# Patient Record
Sex: Male | Born: 1990 | Race: Black or African American | Hispanic: No | Marital: Single | Smoking: Never smoker
Health system: Southern US, Community
[De-identification: ages and names within clinical notes are randomized; demographics above are authoritative.]

## PROBLEM LIST (undated history)

## (undated) ENCOUNTER — Ambulatory Visit: Admission: EM | Payer: 59

## (undated) DIAGNOSIS — J45909 Unspecified asthma, uncomplicated: Secondary | ICD-10-CM

## (undated) DIAGNOSIS — L309 Dermatitis, unspecified: Secondary | ICD-10-CM

## (undated) HISTORY — PX: LEG SURGERY: SHX1003

## (undated) HISTORY — DX: Dermatitis, unspecified: L30.9

---
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
## 2015-07-26 ENCOUNTER — Other Ambulatory Visit: Payer: Self-pay

## 2015-07-29 ENCOUNTER — Ambulatory Visit: Payer: Self-pay | Admitting: Allergy and Immunology

## 2015-08-05 ENCOUNTER — Encounter: Payer: Self-pay | Admitting: Allergy and Immunology

## 2015-08-05 ENCOUNTER — Ambulatory Visit (INDEPENDENT_AMBULATORY_CARE_PROVIDER_SITE_OTHER): Payer: BLUE CROSS/BLUE SHIELD | Admitting: Allergy and Immunology

## 2015-08-05 VITALS — BP 116/68 | HR 70 | Temp 98.0°F | Resp 18

## 2015-08-05 DIAGNOSIS — J309 Allergic rhinitis, unspecified: Secondary | ICD-10-CM

## 2015-08-05 DIAGNOSIS — H1010 Acute atopic conjunctivitis, unspecified eye: Secondary | ICD-10-CM | POA: Diagnosis not present

## 2015-08-05 DIAGNOSIS — J4530 Mild persistent asthma, uncomplicated: Secondary | ICD-10-CM

## 2015-08-05 MED ORDER — MOMETASONE FUROATE 50 MCG/ACT NA SUSP: 2.0000 | Freq: Every day | NASAL | Status: DC

## 2015-08-05 MED ORDER — EPINEPHRINE 0.3 MG/0.3ML IJ SOAJ: 0.3000 mg | Freq: Once | INTRAMUSCULAR | Status: DC

## 2015-08-05 MED ORDER — BECLOMETHASONE DIPROPIONATE 40 MCG/ACT IN AERS: 2.0000 | INHALATION_SPRAY | Freq: Every day | RESPIRATORY_TRACT | Status: DC

## 2015-08-05 MED ORDER — ALBUTEROL SULFATE 108 (90 BASE) MCG/ACT IN AEPB: 2.0000 | INHALATION_SPRAY | RESPIRATORY_TRACT | Status: DC | PRN

## 2015-08-05 MED ORDER — ALBUTEROL SULFATE HFA 108 (90 BASE) MCG/ACT IN AERS: 2.0000 | INHALATION_SPRAY | Freq: Four times a day (QID) | RESPIRATORY_TRACT | Status: DC | PRN

## 2015-08-05 NOTE — Patient Instructions (Addendum)
Continue Allegra.  Change Pulmicort to QVAR 1 to 2 puffs daily.  Continue food avoidance pattern.  Schedule egg in office challenge.  As needed Nasonex, ProAir respiclick, Epi-pen, Benadryl.   Saline nasal wash once daily and as needed.   Call with additional questions or concerns.   Follow-up in 6 months or sooner if needed.

## 2015-08-05 NOTE — Progress Notes (Signed)
FOLLOW UP NOTE  RE: Leonard Fuller MRN: [id number] DOB: 04-Aug-1990 [facility] [place] [address] [place] [address] Date of Office Visit: 08/05/2015  Subjective:  Leonard Fuller is a 25 y.o. male who presents today for Medication Management  Assessment:   1. Mild persistent asthma, well controlled.    2. Allergic rhinoconjunctivitis.    3.      Food allergy--avoidance and emergency action plan in place.  (Peanut, tree nut, fish, egg, dairy, shellfish) 4.      Negative egg skin prick testing with minimally reactive class I specific IgE egg white, both in 2013. Plan:   Meds ordered this encounter  Medications  . albuterol (PROAIR HFA) 108 (90 Base) MCG/ACT inhaler    Sig: Inhale 2 puffs into the lungs every 6 (six) hours as needed for wheezing or shortness of breath.    Dispense:  1 Inhaler    Refill:  1  . mometasone (NASONEX) 50 MCG/ACT nasal spray    Sig: Place 2 sprays into the nose daily.    Dispense:  17 g    Refill:  5  . EPINEPHrine (EPIPEN 2-PAK) 0.3 mg/0.3 mL IJ SOAJ injection    Sig: Inject 0.3 mLs (0.3 mg total) into the muscle once.    Dispense:  2 Device    Refill:  1  . beclomethasone (QVAR) 40 MCG/ACT inhaler    Sig: Inhale 2 puffs into the lungs daily.    Dispense:  1 Inhaler    Refill:  3  . Albuterol Sulfate (PROAIR RESPICLICK) 108 (90 Base) MCG/ACT AEPB    Sig: Inhale 2 puffs into the lungs as needed.    Dispense:  1 each    Refill:  1   Patient Instructions  1.  Continue Allegra daily. 2.  Change Pulmicort to QVAR 1 to 2 puffs daily (based on insurance formulary). 3.  Continue food avoidance pattern as currently. 4.  Schedule egg in office challenge. 5.  As needed Nasonex, ProAir respiclick, Epi-pen, Benadryl. 6.  Saline nasal wash once daily and as needed. 7.  Call with additional questions or concerns. 8.  Follow-up in 6 months or sooner if needed.   HPI: Leonard Fuller returns to the office in follow-up of  asthma, allergic rhinoconjunctivitis and food allergy.  Since his last visit in July 2015, he describes doing well without any recurring upper or lower respiratory symptoms.  He reports medications are beneficial in avoiding congestion, rhinorrhea, sneezing or any cough.  He continues to avoid peanuts, nuts, fish, eggs, dairy and shellfish without difficulty.  He reports being consistent with Pulmicort each morning along with Allegra but otherwise uses medications as needed.  He feels his last albuterol use was more than 6 months ago and denies any acute reaction, rashes or other recurring concerns.  He is pleased with how well he has done and is requesting refill medications.  Denies ED or urgent care visits, prednisone or antibiotic courses. Reports sleep and activity are normal.  Plans to attend [employer] in the fall.  Leonard Fuller has a current medication list which includes the following prescription(s): albuterol, budesonide, epinephrine, fexofenadine, mometasone, albuterol sulfate, and beclomethasone.   Drug Allergies: No Known Allergies  Objective:   Filed Vitals:   08/05/15 1724  BP: 116/68  Pulse: 70  Temp: 98 F (36.7 C)  Resp: 18   Physical Exam  Constitutional: He is well-developed, well-nourished, and in no distress.  HENT:  Head: Atraumatic.  Right Ear: Tympanic membrane and ear canal normal.  Left Ear: Tympanic membrane and ear canal normal.  Nose: Mucosal edema present. No rhinorrhea. No epistaxis.  Mouth/Throat: Oropharynx is clear and moist and mucous membranes are normal. No oropharyngeal exudate, posterior oropharyngeal edema or posterior oropharyngeal erythema.  Eyes: Conjunctivae are normal.  Neck: Neck supple.  Cardiovascular: Normal rate, S1 normal and S2 normal.   No murmur heard. Pulmonary/Chest: Effort normal and breath sounds normal. He has no wheezes. He has no rhonchi. He has no rales.  Lymphadenopathy:    He has no cervical adenopathy.    Skin: Skin is warm and intact. No rash noted. No cyanosis. Nails show no clubbing.   Diagnostics: Spirometry:  FVC  4.33--97%, FEV1 3.51--91%.   Sachiko Methot M. Willa Rough, MD  cc: Gwynneth Aliment, MD

## 2015-08-16 ENCOUNTER — Other Ambulatory Visit: Payer: Self-pay

## 2015-08-16 MED ORDER — BECLOMETHASONE DIPROPIONATE 80 MCG/ACT IN AERS: INHALATION_SPRAY | RESPIRATORY_TRACT | Status: DC

## 2016-09-18 ENCOUNTER — Ambulatory Visit: Payer: BLUE CROSS/BLUE SHIELD | Admitting: Allergy & Immunology

## 2016-11-23 ENCOUNTER — Encounter: Payer: Self-pay | Admitting: Allergy & Immunology

## 2016-11-23 ENCOUNTER — Ambulatory Visit (INDEPENDENT_AMBULATORY_CARE_PROVIDER_SITE_OTHER): Payer: BLUE CROSS/BLUE SHIELD | Admitting: Allergy & Immunology

## 2016-11-23 ENCOUNTER — Encounter (INDEPENDENT_AMBULATORY_CARE_PROVIDER_SITE_OTHER): Payer: Self-pay

## 2016-11-23 VITALS — BP 110/70 | HR 92 | Temp 98.0°F | Resp 18 | Ht 69.49 in | Wt 281.2 lb

## 2016-11-23 DIAGNOSIS — J4540 Moderate persistent asthma, uncomplicated: Secondary | ICD-10-CM

## 2016-11-23 DIAGNOSIS — T781XXD Other adverse food reactions, not elsewhere classified, subsequent encounter: Secondary | ICD-10-CM

## 2016-11-23 DIAGNOSIS — J3089 Other allergic rhinitis: Secondary | ICD-10-CM | POA: Diagnosis not present

## 2016-11-23 NOTE — Patient Instructions (Addendum)
1. Moderate persistent asthma, uncomplicated - Lung testing did not look great today, likely because you are not using your Qvar regularly.  - We will change of Breo 100/25 one inhalation once daily.  - Breo contains a long acting form of albuterol and an inhaled steroid.  - Call us if you like this and we can send in a prescription.  - Daily controller medication(s): Breo 100/25 one inhalation once daily - Rescue medications: ProAir 4 puffs every 4-6 hours as needed - Asthma control goals:  * Full participation in all desired activities (may need albuterol before activity) * Albuterol use two time or less a week on average (not counting use with activity) * Cough interfering with sleep two time or less a month * Oral steroids no more than once a year * No hospitalizations  2. Perennial allergic rhinitis - Continue with Nasonex two sprays per nostril daily. - Continue with Allegra one tablet daily.   3. Adverse food reaction (peanut, tree nuts, egg, shellfish, milk) - We will get blood work to see where all of your allergy levels are standing. - We should call within two weeks with the results, but if you do not hear from us, please give us a call.  - Once we have the lab results, we can discuss plans for introducing them in his diet again.  4. Return in about 4 weeks (around 12/21/2016).  Please inform us of any Emergency Department visits, hospitalizations, or changes in symptoms. Call us before going to the ED for breathing or allergy symptoms since we might be able to fit you in for a sick visit. Feel free to contact us anytime with any questions, problems, or concerns.  It was a pleasure to see you and your family again today! Happy spring!   Websites that have reliable patient information: 1. American Academy of Asthma, Allergy, and Immunology: www.aaaai.org 2. Food Allergy Research and Education (FARE): foodallergy.org 3. Mothers of Asthmatics:  http://www.asthmacommunitynetwork.org 4. American College of Allergy, Asthma, and Immunology: www.acaai.org

## 2016-11-23 NOTE — Progress Notes (Signed)
FOLLOW UP  Date of Service/Encounter:  11/23/16   Assessment:   Moderate persistent asthma, uncomplicated  Perennial allergic rhinitis  Adverse food reaction (peanuts, tree nuts, milk, egg, shellfish)   Asthma Reportables:  Severity: moderate persistent  Risk: high Control: not well controlled    Plan/Recommendations:   1. Moderate persistent asthma, uncomplicated - Lung testing did not look great today, likely because you are not using your Qvar regularly.  Leonard Fuller clearly needs an easier regimen so that he will be able to more regularly follow it.  - He had marked reversibility today on spiro, and given the lack of insight into his symptoms, I feel that he does not have a good idea of what it is like to actually breathe normally.  - We will change of Breo 100/25 one inhalation once daily (sample provided) - Breo contains a long acting form of albuterol and an inhaled steroid.  - Call us if you like this and we can send in a prescription.  - Daily controller medication(s): Breo 100/25 one inhalation once daily - Rescue medications: ProAir 4 puffs every 4-6 hours as needed - Asthma control goals:  * Full participation in all desired activities (may need albuterol before activity) * Albuterol use two time or less a week on average (not counting use with activity) * Cough interfering with sleep two time or less a month * Oral steroids no more than once a year * No hospitalizations  2. Perennial allergic rhinitis - Continue with Nasonex two sprays per nostril daily. - Continue with Allegra one tablet daily.   3. Adverse food reaction (peanut, tree nuts, egg, shellfish, milk) - We will get blood work to see where all of your allergy levels are standing. - We should call within two weeks with the results, but if you do not hear from us, please give us a call.  - Once we have the lab results, we can discuss plans for introducing them in his diet again.  4. Return in  about 4 weeks (around 12/21/2016).   Subjective:   Leonard Fuller is a 26 y.o. male presenting today for follow up of  Chief Complaint  Patient presents with  . Asthma    Leonard Fuller Steinkamp has a history of the following: There are no active problems to display for this patient.   History obtained from: chart review and patient and his mother who accompanies him today.  Leonard Fuller Fuller was referred by Leonard Fuller, Robyn, MD.     Leonard DeterSamuel is a 26 y.o. male presenting for a follow up visit. He was last seen in January 2017 by Dr. Willa Fuller, who has since left the practice. At that time, his mild persistent asthma was under good control. He was encouraged to continue avoidance of peanuts, tree nuts, fish, egg, and dairy, and shellfish. He was changed to Qvar 80 g 1-2 inhalations daily. It was recommended that he schedule and egg challenge in the office setting.  Asthma/Respiratory Symptom History: He reports that his testing is usually not great. He is no longer on his Qvar regularly. He is using it on average 1-2 times per week. He reports feeling good but he does cough at night occasionally. He needs prednisone around once per year or less. He is using his rescue inhaler last month with physical activity.   Allergic Rhinitis Symptom History: He recently got back from school and was sneezing. He is using his Nasonex and antihistamine, which he reports better compliance  with compared to his asthma regimen.  Food Allergy Symptom History: He can eat all seafood except shellfish. He has avoided scrambled eggs. Peanuts and tree nuts are sensitizations only, and he has never had them at all. He did have a reaction one year ago when he was a [employer]. They avoid all dairy, even at home. Mom reports that they have more a paleo/keto diet at home. His last testing was performed in August 2013 and was positive to peanut, milk, cashew, pecan, lobster, scallops, shrimp, crab, hazelnut, and [place] nut. Testing was  negative to egg white and casein.   Otherwise, there have been no changes to his past medical history, surgical history, family history, or social history. He is currently pursuing a degree in psychology.     Review of Systems: a 14-point review of systems is pertinent for what is mentioned in HPI.  Otherwise, all other systems were negative. Constitutional: negative other than that listed in the HPI Eyes: negative other than that listed in the HPI Ears, nose, mouth, throat, and face: negative other than that listed in the HPI Respiratory: negative other than that listed in the HPI Cardiovascular: negative other than that listed in the HPI Gastrointestinal: negative other than that listed in the HPI Genitourinary: negative other than that listed in the HPI Integument: negative other than that listed in the HPI Hematologic: negative other than that listed in the HPI Musculoskeletal: negative other than that listed in the HPI Neurological: negative other than that listed in the HPI Allergy/Immunologic: negative other than that listed in the HPI    Objective:   Blood pressure 110/70, pulse 92, temperature 98 F (36.7 C), temperature source Oral, resp. rate 18, height 5' 9.49" (1.765 m), weight 281 lb 3.2 oz (127.6 kg), SpO2 97 %. Body mass index is 40.94 kg/m.   Physical Exam:  General: Alert, interactive, in no acute distress. Large male. Very friendly.  Eyes: No conjunctival injection present on the right, No conjunctival injection present on the left, PERRL bilaterally, No discharge on the right, No discharge on the left and No Horner-Trantas dots present Ears: Right TM pearly gray with normal light reflex, Left TM pearly gray with normal light reflex, Right TM intact without perforation and Left TM intact without perforation.  Nose/Throat: External nose within normal limits, nasal crease present and septum midline, turbinates edematous and pale with clear discharge, post-pharynx  erythematous with cobblestoning in the posterior oropharynx. Tonsils 2+ without exudates Neck: Supple without thyromegaly. Lungs: Decreased breath sounds with expiratory wheezing bilaterally. No increased work of breathing. No crackles or wheezes noted.  CV: Normal S1/S2, no murmurs. Capillary refill <2 seconds.  Skin: Warm and dry, without lesions or rashes. Neuro:   Grossly intact. No focal deficits appreciated. Responsive to questions.   Diagnostic studies:  Spirometry: results abnormal (FEV1: 2.18/58%, FVC: 2.77/63%, FEV1/FVC: 79%).   2980 Spirometry consistent with possible restrictive disease. Albuterol nebulizer treatment given in clinic with significant improvement. His FVC increased (34%) and his FEV1 increase (36%). His lung values normalized completely following the treatment.  Allergy Studies: none   Malachi Bonds, MD [facility]

## 2016-12-07 ENCOUNTER — Other Ambulatory Visit: Payer: Self-pay | Admitting: Allergy & Immunology

## 2016-12-07 MED ORDER — FLUTICASONE FUROATE-VILANTEROL 100-25 MCG/INH IN AEPB: 1.0000 | INHALATION_SPRAY | Freq: Every day | RESPIRATORY_TRACT | 2 refills | Status: DC

## 2016-12-07 NOTE — Telephone Encounter (Signed)
patient received samples of BREO 100/25 and needs a script called into [facility] on [address]

## 2016-12-07 NOTE — Telephone Encounter (Signed)
Prescription has been sent.

## 2016-12-19 LAB — ALLERGEN, PEANUT COMPONENT PANEL
ARA H 9 (F427: 0.55 kU/L — AB
Ara h 1 (f422): 0.88 kU/L — ABNORMAL HIGH
Ara h 2 (f423): 2.89 kU/L — ABNORMAL HIGH
Ara h 8 (f352): 6.9 kU/L — ABNORMAL HIGH

## 2016-12-19 LAB — ALLERGY PANEL 18, NUT MIX GROUP
ALMONDS: 2.09 kU/L — AB
Cashew IgE: 4.42 kU/L — ABNORMAL HIGH
Coconut: <0.1 kU/L
HAZELNUT: 14.5 kU/L — AB
PECAN NUT: 0.36 kU/L — AB
Peanut IgE: 3.16 kU/L — ABNORMAL HIGH
SESAME SEED IGE: 4.18 kU/L — AB

## 2016-12-19 LAB — ALLERGEN MILK: MILK IGE: 0.27 kU/L — AB

## 2016-12-19 LAB — EGG COMPONENT PANEL

## 2016-12-19 LAB — ALLERGY-SHELLFISH PANEL
Clams: <0.1 kU/L
Crab: 0.47 kU/L — ABNORMAL HIGH
LOBSTER: 0.26 kU/L — AB
SHRIMP IGE: 0.9 kU/L — AB

## 2016-12-19 LAB — ALLERGEN EGG WHITE F1: Egg White IgE: <0.1 kU/L

## 2016-12-21 ENCOUNTER — Ambulatory Visit (INDEPENDENT_AMBULATORY_CARE_PROVIDER_SITE_OTHER): Payer: BLUE CROSS/BLUE SHIELD | Admitting: Allergy & Immunology

## 2016-12-21 ENCOUNTER — Encounter: Payer: Self-pay | Admitting: Allergy & Immunology

## 2016-12-21 VITALS — BP 120/80 | HR 80 | Temp 98.0°F | Resp 18

## 2016-12-21 DIAGNOSIS — J3089 Other allergic rhinitis: Secondary | ICD-10-CM

## 2016-12-21 DIAGNOSIS — J4540 Moderate persistent asthma, uncomplicated: Secondary | ICD-10-CM | POA: Insufficient documentation

## 2016-12-21 DIAGNOSIS — T781XXD Other adverse food reactions, not elsewhere classified, subsequent encounter: Secondary | ICD-10-CM | POA: Diagnosis not present

## 2016-12-21 DIAGNOSIS — T781XXA Other adverse food reactions, not elsewhere classified, initial encounter: Secondary | ICD-10-CM | POA: Insufficient documentation

## 2016-12-21 NOTE — Progress Notes (Signed)
FOLLOW UP  Date of Service/Encounter:  12/21/16   Assessment:   Moderate persistent asthma without complication - better control with improved compliance  Perennial allergic rhinitis  Adverse food reaction (peanuts, tree nuts, shellfish, milk, egg)   Asthma Reportables:  Severity: moderate persistent  Risk: low Control: well controlled  Plan/Recommendations:   1. Moderate persistent asthma, uncomplicated - Lung testing looked great today. - We will continue with Breo since this is working so well.  - Daily controller medication(s): Breo 100/25 one inhalation once daily - Rescue medications: ProAir 4 puffs every 4-6 hours as needed - Asthma control goals:  * Full participation in all desired activities (may need albuterol before activity) * Albuterol use two time or less a week on average (not counting use with activity) * Cough interfering with sleep two time or less a month * Oral steroids no more than once a year * No hospitalizations  2. Perennial allergic rhinitis - Continue with Nasonex two sprays per nostril daily as needed.  - Continue with Allegra one tablet daily.   3. Adverse food reaction (peanut, tree nuts, egg, shellfish, milk)  - Peanut: allergy levels low enough to consider an office challenge if you would like  - Egg: all negative, OK to eat at home including scrambled egg  - Milk: very low, OK to eat at home (has never had a clinical reaction)  - Shellfish panel: very low, OK to eat at home (has never had clinical reaction)  - Hazelnuts and cashews: elevated levels, so avoid those  - Pecan and almond: low enough to consider pecan and almond challenge in the office setting  4. Return in about 2 months (around 02/20/2017).   Subjective:   Leonard Fuller is a 26 y.o. male presenting today for follow up of  Chief Complaint  Patient presents with  . Asthma    EMMANUELL Fuller has a history of the following: There are no active problems to display  for this patient.   History obtained from: chart review and patient.  Leonard Fuller was referred by Leonard Peng, MD.     Leonard Fuller is a 27 y.o. male presenting for a follow up visit. I last saw Leonard Fuller in May 2018 proximally one month ago. At that time, his lung testing might terrible, but he reported to me he was not using his Qvar. Because I felt that he would benefit from an easier regimen, I started him on Breo 100/25 one puff once daily. We continued him on Nasonex 2 sprays per nostril daily as well as Allegra once daily for his perennial allergic rhinitis. He had a history of multiple food allergies including peanut, tree nut, egg, shellfish, and milk. We did obtain repeat blood testing since it had been several years since this was performed. Peanut allergy levels were low enough to consider in office challenge, as were shellfish, pecan, and almond. Egg was all negative and milk was very low, therefore I recommended that he eat feeds at home. Some tree nuts are still elevated including hazelnuts and cashew, therefore I recommended continued avoidance.  Since the last visit, Leonard Fuller has done well. He has remained on the [facility] and feels that this has helped. [name]'s asthma has been well controlled. He has not required rescue medication, experienced nocturnal awakenings due to lower respiratory symptoms, nor have activities of daily living been limited. He has required no Emergency Department or Urgent Care visits for his asthma. He has required zero courses  of systemic steroids for asthma exacerbations since the last visit. ACT score today is 20, indicating excellent asthma symptom control.   Allergic rhinitis is well controlled at this time. He only uses the Nasonex as needed and he has not needed it recently. He has avoided all of his foods. He is not particularly interested in introducing peanuts and tree nuts at this time. He does not like cow's milk, but he is willing to give it a try. Currently  he is drinking only rice milk, occasionally with some whey added as well. He is excited about eating shellfish, and in fact he eats calamari on a daily basis. He is excited about about eating eggs as well. He has only ever had a clinical reaction to an unknown tree nut.  Otherwise, there have been no changes to his past medical history, surgical history, family history, or social history.    Review of Systems: a 14-point review of systems is pertinent for what is mentioned in HPI.  Otherwise, all other systems were negative. Constitutional: negative other than that listed in the HPI Eyes: negative other than that listed in the HPI Ears, nose, mouth, throat, and face: negative other than that listed in the HPI Respiratory: negative other than that listed in the HPI Cardiovascular: negative other than that listed in the HPI Gastrointestinal: negative other than that listed in the HPI Genitourinary: negative other than that listed in the HPI Integument: negative other than that listed in the HPI Hematologic: negative other than that listed in the HPI Musculoskeletal: negative other than that listed in the HPI Neurological: negative other than that listed in the HPI Allergy/Immunologic: negative other than that listed in the HPI    Objective:   Blood pressure 120/80, pulse 80, temperature 98 F (36.7 C), temperature source Oral, resp. rate 18, SpO2 98 %. There is no height or weight on file to calculate BMI.   Physical Exam:  General: Alert, interactive, in no acute distress. Very boisterous and engaging.  Eyes: No conjunctival injection present on the right, No conjunctival injection present on the left, PERRL bilaterally, No discharge on the right, No discharge on the left and No Horner-Trantas dots present Ears: Right TM pearly gray with normal light reflex, Left TM pearly gray with normal light reflex, Right TM intact without perforation and Left TM intact without perforation.    Nose/Throat: External nose within normal limits and septum midline, turbinates markedly edematous and pale with clear discharge, post-pharynx erythematous with cobblestoning in the posterior oropharynx. Tonsils 2+ without exudates Neck: Supple without thyromegaly. Lungs: Clear to auscultation without wheezing, rhonchi or rales. No increased work of breathing. CV: Normal S1/S2, no murmurs. Capillary refill <2 seconds.  Skin: Warm and dry, without lesions or rashes. Neuro:   Grossly intact. No focal deficits appreciated. Responsive to questions.   Diagnostic studies:   Spirometry: results normal (FEV1: 3.31/88%, FVC: 3.98/91%, FEV1/FVC: 83%).    Spirometry consistent with normal pattern.   Allergy Studies: none     Malachi BondsJoel Nekita Pita, MD [facility] [place]

## 2016-12-21 NOTE — Patient Instructions (Addendum)
1. Moderate persistent asthma, uncomplicated - Lung testing looked great today. - We will continue with Breo since this is working so well.  - Daily controller medication(s): Breo 100/25 one inhalation once daily - Rescue medications: ProAir 4 puffs every 4-6 hours as needed - Asthma control goals:  * Full participation in all desired activities (may need albuterol before activity) * Albuterol use two time or less a week on average (not counting use with activity) * Cough interfering with sleep two time or less a month * Oral steroids no more than once a year * No hospitalizations  2. Perennial allergic rhinitis - Continue with Nasonex two sprays per nostril daily as needed.  - Continue with Allegra one tablet daily.   3. Adverse food reaction (peanut, tree nuts, egg, shellfish, milk) 1. Peanut - allergy levels low enough to consider an office challenge if you would like 2. Egg - all negative, OK to eat at home including scrambled egg 3. Milk - very low, OK to eat at home 4. Shellfish panel -  very low, OK to eat at home 5. Tree nuts - still elevated to hazelnut and cashew, so avoid those. Levels are low to pecan and almond and we can do an office challenge if you would like.   4. Return in about 2 months (around 02/20/2017).  Please inform us of any Emergency Department visits, hospitalizations, or changes in symptoms. Call us before going to the ED for breathing or allergy symptoms since we might be able to fit you in for a sick visit. Feel free to contact us anytime with any questions, problems, or concerns.  It was a pleasure to see you and your family again today! Have a great summer!!   Websites that have reliable patient information: 1. American Academy of Asthma, Allergy, and Immunology: www.aaaai.org 2. Food Allergy Research and Education (FARE): foodallergy.org 3. Mothers of Asthmatics: http://www.asthmacommunitynetwork.org 4. American College of Allergy, Asthma, and  Immunology: www.acaai.org

## 2017-01-05 DIAGNOSIS — R0683 Snoring: Secondary | ICD-10-CM | POA: Insufficient documentation

## 2017-01-05 DIAGNOSIS — H6123 Impacted cerumen, bilateral: Secondary | ICD-10-CM | POA: Insufficient documentation

## 2017-01-24 ENCOUNTER — Ambulatory Visit (INDEPENDENT_AMBULATORY_CARE_PROVIDER_SITE_OTHER): Payer: BLUE CROSS/BLUE SHIELD

## 2017-01-24 ENCOUNTER — Encounter (HOSPITAL_COMMUNITY): Payer: Self-pay | Admitting: Emergency Medicine

## 2017-01-24 ENCOUNTER — Ambulatory Visit (HOSPITAL_COMMUNITY)
Admission: EM | Admit: 2017-01-24 | Discharge: 2017-01-24 | Disposition: A | Discharge status: Home / Self Care | Payer: BLUE CROSS/BLUE SHIELD | Attending: Internal Medicine | Admitting: Internal Medicine

## 2017-01-24 DIAGNOSIS — S92502A Displaced unspecified fracture of left lesser toe(s), initial encounter for closed fracture: Secondary | ICD-10-CM

## 2017-01-24 DIAGNOSIS — S8012XA Contusion of left lower leg, initial encounter: Secondary | ICD-10-CM | POA: Diagnosis not present

## 2017-01-24 DIAGNOSIS — W010XXA Fall on same level from slipping, tripping and stumbling without subsequent striking against object, initial encounter: Secondary | ICD-10-CM

## 2017-01-24 HISTORY — DX: Unspecified asthma, uncomplicated: J45.909

## 2017-01-24 NOTE — Discharge Instructions (Signed)
Buddy tape the fourth and fifth toes together. Wear the hard sole shoe to allow better healing of the fifth toe. Healing has already taken place but not completely. He may take another 2-3 weeks for complete healing. May take ibuprofen as needed for pain. X-ray showed that the tibia and fibula are completely intact. No acute injury.

## 2017-01-24 NOTE — ED Triage Notes (Signed)
[facility] a [facility] of pain to the 5th toe on his left foot secondary to hitting it on a door fame 2 weeks ago. The patient stated that he has been on his feet a lot at work after and has also fallen one time.

## 2017-01-24 NOTE — ED Provider Notes (Signed)
CSN: [id number]     Arrival date & time 01/24/17  1549 History   None    Chief Complaint  Patient presents with  . Toe Injury   (Consider location/radiation/quality/duration/timing/severity/associated sxs/prior Treatment) 26 year old male states that 2 weeks ago he slipped and fell stubbing his left fifth toe. Complains of persistent pain to the toe especially with ambulation. He also notes later that he struck his anterior shin on an object during that fall. He continues to be sore and having tenderness in the mid tip. He states there was some initial swelling and a little pain. He is able to bear full weight.      Past Medical History:  Diagnosis Date  . Asthma    History reviewed. No pertinent surgical history. Family History  Problem Relation Age of Onset  . Asthma Father   . Eczema Father   . Allergic rhinitis Sister   . Asthma Sister   . Food Allergy Sister    Social History  Substance Use Topics  . Smoking status: Never Smoker  . Smokeless tobacco: Never Used  . Alcohol use No    Review of Systems  Constitutional: Negative.   Musculoskeletal: Negative for back pain, myalgias, neck pain and neck stiffness.       As per history of present illness  Skin: Negative for wound.  Neurological: Negative.   All other systems reviewed and are negative.   Allergies  Eggs or egg-derived products; Milk-related compounds; Other; Peanut oil; and Shellfish-derived products  Home Medications   Prior to Admission medications   Medication Sig Start Date End Date Taking? Authorizing Provider  albuterol (PROAIR HFA) 108 (90 Base) MCG/ACT inhaler Inhale 2 puffs into the lungs every 6 (six) hours as needed for wheezing or shortness of breath. 08/05/15   Baxter HireHicks, Roselyn M, MD  EPINEPHrine (EPIPEN 2-PAK) 0.3 mg/0.3 mL IJ SOAJ injection Inject 0.3 mLs (0.3 mg total) into the muscle once. 08/05/15   Baxter HireHicks, Roselyn M, MD  fexofenadine (ALLEGRA) 180 MG tablet Take 180 mg by mouth daily.     [provider]  fluticasone furoate-vilanterol (BREO ELLIPTA) 100-25 MCG/INH AEPB Inhale 1 puff into the lungs daily. 12/07/16   Bobbitt, Heywood Ilesalph Carter, MD  mometasone (NASONEX) 50 MCG/ACT nasal spray Place 2 sprays into the nose daily. 08/05/15   Baxter HireHicks, Roselyn M, MD   Meds Ordered and Administered this Visit  Medications - No data to display  BP 134/90 (BP Location: Right Arm)   Pulse (!) 111   Temp 97.7 F (36.5 C) (Oral)   Resp 20   SpO2 99%  No data found.   Physical Exam  Constitutional: He is oriented to person, place, and time. He appears well-developed and well-nourished.  HENT:  Head: Normocephalic and atraumatic.  Eyes: EOM are normal. Left eye exhibits no discharge.  Neck: Normal range of motion. Neck supple.  Pulmonary/Chest: Effort normal.  Musculoskeletal:  Left lower extremity with out edema or deformity. The patient is able to perform full range of motion. There is no appreciable swelling, wound, deformity. Tapping along the anterior tibia elicits some percussion tenderness in one small area of the mid anterior tibia. Tenderness to the left fifth digit. No foot pain.  Neurological: He is alert and oriented to person, place, and time. No cranial nerve deficit.  Skin: Skin is warm and dry.  Psychiatric: He has a normal mood and affect.  Nursing note and vitals reviewed.   Urgent Care Course     Procedures (including  critical care time)  Labs Review Labs Reviewed - No data to display  Imaging Review Dg Tibia/fibula Left  Result Date: 01/24/2017 CLINICAL DATA:  Initial evaluation for acute pain status post recent fall. EXAM: LEFT TIBIA AND FIBULA - 2 VIEW COMPARISON:  None available. FINDINGS: No acute fracture or dislocation. Remotely healed fractures of the proximal tibia and fibula noted. Associated osseous excrescence at the proximal tibia. Fixation hardware present at the distal femur. No hardware complication. No soft tissue abnormality.  IMPRESSION: 1. No acute osseous abnormality about the left tibia/fibula. 2. Remotely healed fractures of the left tibia/fibula. Electronically Signed   By: Rise Mu M.D.   On: 01/24/2017 17:48   Dg Foot Complete Left  Result Date: 01/24/2017 CLINICAL DATA:  Pain after injury 2 weeks prior EXAM: LEFT FOOT - COMPLETE 3+ VIEW COMPARISON:  May 04, 2005 FINDINGS: Frontal, oblique, and lateral views were obtained. There is a healing fracture of the midportion of the fifth proximal phalanx with alignment near anatomic. There is callus formation in this area. No other fracture. No dislocation. Joint spaces appear normal. No erosive change. IMPRESSION: Healing fracture midportion fifth proximal phalanx with alignment near anatomic. No other fracture. No dislocation. No appreciable arthropathic change. Electronically Signed   By: Bretta Bang III M.D.   On: 01/24/2017 17:31     Visual Acuity Review  Right Eye Distance:   Left Eye Distance:   Bilateral Distance:    Right Eye Near:   Left Eye Near:    Bilateral Near:         MDM   1. Contusion of left tibia   2. Fall from slip, trip, or stumble, initial encounter   3. Fracture of fifth toe, left, closed, initial encounter    Buddy tape the fourth and fifth toes together. Wear the hard sole shoe to allow better healing of the fifth toe. Healing has already taken place but not completely. He may take another 2-3 weeks for complete healing. May take ibuprofen as needed for pain. X-ray showed that the tibia and fibula are completely intact. No acute injury. Hard sole shoe    Hayden Rasmussen, NP 01/24/17 1805

## 2017-02-08 ENCOUNTER — Ambulatory Visit: Payer: BLUE CROSS/BLUE SHIELD | Admitting: Allergy & Immunology

## 2017-02-19 ENCOUNTER — Other Ambulatory Visit: Payer: Self-pay

## 2017-02-19 NOTE — Telephone Encounter (Signed)
[facility] a [facility] Qvar [facility]. Patient in no longer on Qvar, patient is now on Breo. [facility] [address]

## 2017-02-26 ENCOUNTER — Other Ambulatory Visit: Payer: Self-pay | Admitting: Allergy & Immunology

## 2017-02-26 MED ORDER — EPINEPHRINE 0.3 MG/0.3ML IJ SOAJ: 0.3000 mg | Freq: Once | INTRAMUSCULAR | 1 refills | Status: AC

## 2017-03-25 ENCOUNTER — Other Ambulatory Visit: Payer: Self-pay | Admitting: Allergy and Immunology

## 2017-03-26 ENCOUNTER — Telehealth: Payer: Self-pay | Admitting: Allergy & Immunology

## 2017-03-26 NOTE — Telephone Encounter (Signed)
Mom called and needs to have Breo 100/25 [facility] on [address] and also would like to pick up a sample of breo if we have any. [phone].

## 2017-03-26 NOTE — Telephone Encounter (Signed)
One sample of Breo 100/25 mcg left for patient to pick up.  Left message for patient regarding sample pick up.

## 2017-03-26 NOTE — Telephone Encounter (Signed)
Script sent into pharmacy by [employer].

## 2017-06-14 ENCOUNTER — Telehealth: Payer: Self-pay | Admitting: Allergy & Immunology

## 2017-06-14 MED ORDER — ALBUTEROL SULFATE HFA 108 (90 BASE) MCG/ACT IN AERS: 2.0000 | INHALATION_SPRAY | Freq: Four times a day (QID) | RESPIRATORY_TRACT | 0 refills | Status: DC | PRN

## 2017-06-14 NOTE — Telephone Encounter (Signed)
rx refill sent left message advising only one refill would be sent in. No more until he is seen in our office.

## 2017-06-14 NOTE — Telephone Encounter (Signed)
Going out of town need a proair called [facility] on [address]. [phone].

## 2017-07-05 ENCOUNTER — Ambulatory Visit: Payer: BLUE CROSS/BLUE SHIELD | Admitting: Allergy & Immunology

## 2017-07-05 ENCOUNTER — Encounter: Payer: Self-pay | Admitting: Allergy & Immunology

## 2017-07-05 VITALS — BP 118/78 | HR 102 | Wt 292.6 lb

## 2017-07-05 DIAGNOSIS — J4540 Moderate persistent asthma, uncomplicated: Secondary | ICD-10-CM | POA: Diagnosis not present

## 2017-07-05 DIAGNOSIS — J3089 Other allergic rhinitis: Secondary | ICD-10-CM

## 2017-07-05 DIAGNOSIS — T781XXD Other adverse food reactions, not elsewhere classified, subsequent encounter: Secondary | ICD-10-CM | POA: Diagnosis not present

## 2017-07-05 MED ORDER — FLUTICASONE FUROATE-VILANTEROL 100-25 MCG/INH IN AEPB: INHALATION_SPRAY | RESPIRATORY_TRACT | 5 refills | Status: DC

## 2017-07-05 MED ORDER — ALBUTEROL SULFATE HFA 108 (90 BASE) MCG/ACT IN AERS: 2.0000 | INHALATION_SPRAY | Freq: Four times a day (QID) | RESPIRATORY_TRACT | 1 refills | Status: DC | PRN

## 2017-07-05 MED ORDER — MOMETASONE FUROATE 50 MCG/ACT NA SUSP: 2.0000 | Freq: Every day | NASAL | 5 refills | Status: DC

## 2017-07-05 MED ORDER — FEXOFENADINE HCL 180 MG PO TABS: 180.0000 mg | ORAL_TABLET | Freq: Every day | ORAL | 5 refills | Status: DC

## 2017-07-05 NOTE — Progress Notes (Signed)
FOLLOW UP  Date of Service/Encounter:  07/05/17   Assessment:   Moderate persistent asthma without complication - better control with improved compliance  Perennial allergic rhinitis  Adverse food reaction (peanuts, tree nuts, shellfish, milk, egg)   Asthma Reportables:  Severity: moderate persistent  Risk: low Control: well controlled   Plan/Recommendations:   1. Moderate persistent asthma, uncomplicated - Lung testing looked great today. - We will continue with Breo since this is working so well.  - This has been remarkably helpful in getting Leonard Fuller to take his medication on a consistent basis.  - Daily controller medication(s): Breo 100/25 one inhalation once daily - Rescue medications: ProAir 4 puffs every 4-6 hours as needed - Asthma control goals:  * Full participation in all desired activities (may need albuterol before activity) * Albuterol use two time or less a week on average (not counting use with activity) * Cough interfering with sleep two time or less a month * Oral steroids no more than once a year * No hospitalizations  2. Perennial allergic rhinitis - Continue with Nasonex two sprays per nostril daily as needed.  - Continue with Allegra one tablet daily as needed.   3. Adverse food reaction (peanut, tree nuts, egg, shellfish, milk) - has not pursued food challenges at this time - Peanut: allergy levels low enough to consider an office challenge if you would like - Egg: all negative, OK to eat at home including scrambled egg - Milk: very low, OK to eat at home - Shellfish panel: very low, OK to eat at home - Tree nuts: still elevated to hazelnut and cashew, so avoid those. Levels are low to pecan and almond and we can do an office challenge if you would like.   - Maybe you get these challenges done next summer.   4. Return in about 6 months (around 01/03/2018).  Subjective:   Leonard Fuller is a 26 y.o. male presenting today for follow up of  No chief complaint on file.   Leonard Fuller has a history of the following: Patient Active Problem List   Diagnosis Date Noted  . Moderate persistent asthma without complication 12/21/2016  . Perennial allergic rhinitis 12/21/2016  . Adverse food reaction 12/21/2016    History obtained from: chart review and patient and his mother.  Leonard Fuller's Primary Care Provider is Dorothyann PengSanders, Robyn, MD.     Leonard Fuller is a 26 y.o. male presenting for a follow up visit. He was last seen in June 2018. At that time, he was doing very well on the Breo one puff once daily. Compliance has been an issue in the past for him, so the change to Virgel BouquetBreo has been helpful. For his allergic rhinitis, we continued him on Nasonex two sprays per nostril daily as well as Allegra one tablet daily. He has an extensive history of foods allergies, but his highest levels were to cashew and hazelnut. We offered in office challenge to multiple foods, including peanut, egg, milk, shellfish, and pecan and almond. It is not clear that these were ever performed.   Since the last visit, Leonard Fuller has done very well. [facility], he has remained fairly compliant with medications. He remains on Breo one puff once daily. Leonard Fuller's asthma has been well controlled. He has not required rescue medication, experienced nocturnal awakenings due to lower respiratory symptoms, nor have activities of daily living been limited. He has required no Emergency Department or Urgent Care visits for his asthma. He  has required zero courses of systemic steroids for asthma exacerbations since the last visit. ACT score today is 21, indicating excellent asthma symptom control.   Allergic rhinitis is well controlled, but he is using the Nasonex and Allegra only as needed. He has not needed any antibiotics at all. This is typically his best time of the year, so he is feeling fairly good at this point. He continues to avoid all of his triggering foods, including peanut, tree  nuts, egg, shellfish, milk. Although I recommended that he do food challenges to these at the last visit (including egg and milk, which I felt were safe to perform at home), he continues to avoid these anyway. He really does not feel motivated to perform any food challenges. Evidently his father is not excited about these food challenges at all.   Otherwise, there have been no changes to his past medical history, surgical history, family history, or social history.    Review of Systems: a 14-point review of systems is pertinent for what is mentioned in HPI.  Otherwise, all other systems were negative. Constitutional: negative other than that listed in the HPI Eyes: negative other than that listed in the HPI Ears, nose, mouth, throat, and face: negative other than that listed in the HPI Respiratory: negative other than that listed in the HPI Cardiovascular: negative other than that listed in the HPI Gastrointestinal: negative other than that listed in the HPI Genitourinary: negative other than that listed in the HPI Integument: negative other than that listed in the HPI Hematologic: negative other than that listed in the HPI Musculoskeletal: negative other than that listed in the HPI Neurological: negative other than that listed in the HPI Allergy/Immunologic: negative other than that listed in the HPI    Objective:   Blood pressure 118/78, pulse (!) 102, weight 292 lb 9.6 oz (132.7 kg), SpO2 96 %. Body mass index is 42.6 kg/m.   Physical Exam:  General: Alert, interactive, in no acute distress.Pleasant obese male. Very boisterous.  Eyes: No conjunctival injection bilaterally, no discharge on the right, no discharge on the left and no Horner-Trantas dots present. PERRL bilaterally. EOMI without pain. No photophobia.  Ears: Right TM pearly gray with normal light reflex, Left TM pearly gray with normal light reflex, Right TM intact without perforation and Left TM intact without  perforation.  Nose/Throat: External nose within normal limits and septum midline. Turbinates markedly edematous and pale with clear discharge. Posterior oropharynx erythematous with cobblestoning in the posterior oropharynx. Tonsils 2+ without exudates.  Tongue without thrush. Adenopathy: no enlarged lymph nodes appreciated in the anterior cervical, occipital, axillary, epitrochlear, inguinal, or popliteal regions. Lungs: Clear to auscultation without wheezing, rhonchi or rales. No increased work of breathing. CV: Normal S1/S2. No murmurs. Capillary refill <2 seconds.  Skin: Warm and dry, without lesions or rashes. Neuro:   Grossly intact. No focal deficits appreciated. Responsive to questions.  Diagnostic studies:   Spirometry: results normal (FEV1: 3.24/85%, FVC: 4.21/93%, FEV1/FVC: 77%).    Spirometry consistent with normal pattern.  Allergy Studies: none   Malachi BondsJoel Destini Cambre, MD [facility] [place]

## 2017-07-05 NOTE — Patient Instructions (Addendum)
1. Moderate persistent asthma, uncomplicated - Lung testing looked great today. - We will continue with Breo since this is working so well.  - Daily controller medication(s): Breo 100/25 one inhalation once daily - Rescue medications: ProAir 4 puffs every 4-6 hours as needed - Asthma control goals:  * Full participation in all desired activities (may need albuterol before activity) * Albuterol use two time or less a week on average (not counting use with activity) * Cough interfering with sleep two time or less a month * Oral steroids no more than once a year * No hospitalizations  2. Perennial allergic rhinitis - Continue with Nasonex two sprays per nostril daily as needed.  - Continue with Allegra one tablet daily as needed.   3. Adverse food reaction (peanut, tree nuts, egg, shellfish, milk) - Peanut - allergy levels low enough to consider an office challenge if you would like - Egg - all negative, OK to eat at home including scrambled egg - Milk - very low, OK to eat at home - Shellfish panel -  very low, OK to eat at home - Tree nuts - still elevated to hazelnut and cashew, so avoid those. Levels are low to pecan and almond and we can do an office challenge if you would like.  - Maybe you get these challenges done next summer.   4. Return in about 6 months (around 01/03/2018).   Please inform us of any Emergency Department visits, hospitalizations, or changes in symptoms. Call us before going to the ED for breathing or allergy symptoms since we might be able to fit you in for a sick visit. Feel free to contact us anytime with any questions, problems, or concerns.  It was a pleasure to see you and your family again today! Enjoy the holiday season!  Websites that have reliable patient information: 1. American Academy of Asthma, Allergy, and Immunology: www.aaaai.org 2. Food Allergy Research and Education (FARE): foodallergy.org 3. Mothers of Asthmatics:  http://www.asthmacommunitynetwork.org 4. American College of Allergy, Asthma, and Immunology: www.acaai.org

## 2017-11-22 ENCOUNTER — Encounter: Payer: Self-pay | Admitting: Allergy & Immunology

## 2017-11-22 ENCOUNTER — Ambulatory Visit: Payer: BLUE CROSS/BLUE SHIELD | Admitting: Allergy & Immunology

## 2017-11-22 VITALS — BP 112/76 | HR 76 | Resp 16 | Ht 70.8 in | Wt 266.8 lb

## 2017-11-22 DIAGNOSIS — J4540 Moderate persistent asthma, uncomplicated: Secondary | ICD-10-CM | POA: Diagnosis not present

## 2017-11-22 DIAGNOSIS — J3089 Other allergic rhinitis: Secondary | ICD-10-CM

## 2017-11-22 DIAGNOSIS — T781XXD Other adverse food reactions, not elsewhere classified, subsequent encounter: Secondary | ICD-10-CM

## 2017-11-22 MED ORDER — FLUTICASONE FUROATE-VILANTEROL 100-25 MCG/INH IN AEPB: INHALATION_SPRAY | RESPIRATORY_TRACT | 5 refills | Status: DC

## 2017-11-22 MED ORDER — ALBUTEROL SULFATE HFA 108 (90 BASE) MCG/ACT IN AERS: 2.0000 | INHALATION_SPRAY | Freq: Four times a day (QID) | RESPIRATORY_TRACT | 1 refills | Status: DC | PRN

## 2017-11-22 MED ORDER — EPINEPHRINE 0.3 MG/0.3ML IJ SOAJ: INTRAMUSCULAR | 0 refills | Status: DC

## 2017-11-22 MED ORDER — MOMETASONE FUROATE 50 MCG/ACT NA SUSP: 2.0000 | Freq: Every day | NASAL | 5 refills | Status: DC

## 2017-11-22 MED ORDER — FEXOFENADINE HCL 180 MG PO TABS: 180.0000 mg | ORAL_TABLET | Freq: Every day | ORAL | 5 refills | Status: DC

## 2017-11-22 NOTE — Patient Instructions (Addendum)
1. Moderate persistent asthma, uncomplicated - Lung testing looked great today. - We will continue with Breo since this is working so well.  - We will write a note saying that you need to be in the dorm [facility] with a room by yourself. - Daily controller medication(s): Breo 100/25 one inhalation once daily - Rescue medications: ProAir 4 puffs every 4-6 hours as needed - Asthma control goals:  * Full participation in all desired activities (may need albuterol before activity) * Albuterol use two time or less a week on average (not counting use with activity) * Cough interfering with sleep two time or less a month * Oral steroids no more than once a year * No hospitalizations  2. Perennial allergic rhinitis - Continue with Nasonex two sprays per nostril daily as needed.  - Continue with Allegra one tablet daily as needed.   3. Adverse food reaction (peanut, tree nuts, egg, shellfish, milk) - Peanut - allergy levels low enough to consider an office challenge if you would like - Egg - all negative, OK to eat at home including scrambled egg - Milk - very low, OK to eat at home - Shellfish panel -  very low, OK to eat at home - Tree nuts - still elevated to hazelnut and cashew, so avoid those. Levels are low to pecan and almond and we can do an office challenge if you would like.  - Maybe you get these challenges done after your summer camp.   4. Return in about 6 months (around 05/25/2018).   Please inform us of any Emergency Department visits, hospitalizations, or changes in symptoms. Call us before going to the ED for breathing or allergy symptoms since we might be able to fit you in for a sick visit. Feel free to contact us anytime with any questions, problems, or concerns.  It was a pleasure to see you and your family again today! Enjoy the holiday season!  Websites that have reliable patient information: 1. American Academy of Asthma, Allergy, and Immunology: www.aaaai.org 2.  Food Allergy Research and Education (FARE): foodallergy.org 3. Mothers of Asthmatics: http://www.asthmacommunitynetwork.org 4. American College of Allergy, Asthma, and Immunology: www.acaai.org

## 2017-11-22 NOTE — Progress Notes (Signed)
FOLLOW UP  Date of Service/Encounter:  11/22/17   Assessment:   Moderate persistent asthma without complication- better control with improved compliance  Perennial and seasonal allergic rhinitis (grasses, weeds, trees, dust mite, cat, dog, molds)  Adverse food reaction(peanuts, tree nuts, shellfish, milk, egg)   Asthma Reportables: Severity:moderate persistent Risk:low Control:well controlled     Plan/Recommendations:   1. Moderate persistent asthma, uncomplicated - Lung testing looked great today. - We will continue with Breo since this is working so well.  - We will write a note saying that you need to be in the dorm [facility]) with a room by yourself. - Daily controller medication(s): Breo 100/25 one inhalation once daily - Rescue medications: ProAir 4 puffs every 4-6 hours as needed - Asthma control goals:  * Full participation in all desired activities (may need albuterol before activity) * Albuterol use two time or less a week on average (not counting use with activity) * Cough interfering with sleep two time or less a month * Oral steroids no more than once a year * No hospitalizations  2. Perennial allergic rhinitis - Continue with Nasonex two sprays per nostril daily as needed.  - Continue with Allegra one tablet daily as needed.   3. Adverse food reaction (peanut, tree nuts, egg, shellfish, milk) - Peanut - allergy levels low enough to consider an office challenge if you would like - Egg - all negative, OK to eat at home including scrambled egg - Milk - very low, OK to eat at home - Shellfish panel -  very low, OK to eat at home - Tree nuts - still elevated to hazelnut and cashew, so avoid those. Levels are low to pecan and almond and we can do an office challenge if you would like.  - Maybe you get these challenges done after your summer camp.   4. Return in about 6 months (around 05/25/2018).   Subjective:   Leonard Fuller is a 27 y.o.  male presenting today for follow up of  Chief Complaint  Patient presents with  . Asthma    doing good    Leonard Fuller has a history of the following: Patient Active Problem List   Diagnosis Date Noted  . Moderate persistent asthma without complication 12/21/2016  . Perennial allergic rhinitis 12/21/2016  . Adverse food reaction 12/21/2016    History obtained from: chart review and patient and his parents.  [name] Primary Care Provider is [name], MD.     Leonard Fuller is a 27 y.o. male presenting for a follow up visit.  Leonard Fuller is a delightful young man who was last seen in December 2018.  At that time, his asthma was under much better control with Breo 100/25 1 puff daily.  His allergic rhinitis was controlled with Nasonex 2 sprays per nostril daily as well as Allegra 1 tablet daily as needed.  He has a history of multiple food allergies.  We recommended a peanut challenge which she has not pursued.  His egg was negative and we recommended introducing this at home.  His milk was low and we recommended introducing this at home as well.  His shellfish panel was low as well a hazelnut and cashew were still elevated, so we recommended avoidance.  Nd we recommended introducing this at home.   Since the last visit, Leonard Fuller has done very well.  He has been working very hard on losing weight and looks much thinner today.  He has changed  his diet and is trying to exercise more.  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  He would also like a letter today to help him get into an honors dorm, which will help control his asthma symptoms.  Apparently, he moved rooms 3 times this past academic year because his roommates smoked.  This did wreak havoc on his breathing.  Asthma/Respiratory Symptom History: [name] remains on Breo. [name] has been well controlled. He has not required rescue medication,  experienced nocturnal awakenings due to lower respiratory symptoms, nor have activities of daily living been limited. He has required no Emergency Department or Urgent Care visits for his asthma. He has required zero courses of systemic steroids for asthma exacerbations since the last visit. ACT score today is 22, indicating excellent asthma symptom control. He has been compliant with his medications.   Allergic Rhinitis Symptom History: [name] is doing well from a rhinitis perspective. His last testing was performed in August 2013 and was positive to nearly the entire panel, but most prominently the pollens.  He remains on Nasonex 2 sprays per nostril daily as needed as well as Allegra 1 tablet daily.     Food Allergy Symptom History: [name] continues to avoid shellfish.  He does eat fish with scales as well as calamari.  He has actually introduced egg at home, although he does not like scrambled egg.  He is not a fan of cows milk, instead consuming coconut milk.  He does not consume a lot of it but uses some on his cereal each morning.  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  He continues to avoid peanuts as well.  We again discussed doing challenges today, but he is not interested.   Otherwise, there have been no changes to his past medical history, surgical history, family history, or social history.  Review of Systems: a 14-point review of systems is pertinent for what is mentioned in HPI.  Otherwise, all other systems were negative. Constitutional: negative other than that listed in the HPI Eyes: negative other than that listed in the HPI Ears, nose, mouth, throat, and face: negative other than that listed in the HPI Respiratory: negative other than that listed in the HPI Cardiovascular: negative other than that listed in the HPI Gastrointestinal: negative other than that listed in the HPI Genitourinary: negative other than that listed in  the HPI Integument: negative other than that listed in the HPI Hematologic: negative other than that listed in the HPI Musculoskeletal: negative other than that listed in the HPI Neurological: negative other than that listed in the HPI Allergy/Immunologic: negative other than that listed in the HPI    Objective:   Blood pressure 112/76, pulse 76, resp. rate 16, height 5' 10.8" (1.798 m), weight 266 lb 12.8 oz (121 kg). Body mass index is 37.42 kg/m.   Physical Exam:  General: Alert, interactive, in no acute distress. Pleasant. Clearly lost weight since the last time that I saw him.  Eyes: No conjunctival injection bilaterally, no discharge on the right, no discharge on the left and no Horner-Trantas dots present. PERRL bilaterally. EOMI without pain. No photophobia.  Ears: Right TM pearly gray with normal light reflex, Left TM pearly gray with normal light reflex, Right TM intact without perforation and Left TM intact without perforation.  Nose/Throat: External nose within normal limits and  septum midline. Turbinates edematous and pale with clear discharge. Posterior oropharynx erythematous with cobblestoning in the posterior oropharynx. Tonsils 2+ without exudates.  Tongue without thrush. Lungs: Clear to auscultation without wheezing, rhonchi or rales. No increased work of breathing. CV: Normal S1/S2. No murmurs. Capillary refill <2 seconds.  Skin: Warm and dry, without lesions or rashes. Neuro:   Grossly intact. No focal deficits appreciated. Responsive to questions.  Diagnostic studies:   Spirometry: results normal (FEV1: 3.72/92%, FVC: 4.44/93%, FEV1/FVC: 84%).    Spirometry consistent with normal pattern.   Allergy Studies: none    Malachi Bonds, MD  [facility]

## 2017-12-04 ENCOUNTER — Telehealth: Payer: Self-pay | Admitting: Allergy & Immunology

## 2017-12-04 NOTE — Telephone Encounter (Signed)
Peanuts and tree nuts is fine. He can tolerate the other foods that he was allergic to, just not in large amounts. I do not believe that he has tried stove top eggs, though, so you could add that.   Malachi Bonds, MD [facility]

## 2017-12-04 NOTE — Telephone Encounter (Signed)
Peanuts and Tree Nuts are only thing listed as positive. Is this okay to write or do you have additional?

## 2017-12-04 NOTE — Telephone Encounter (Signed)
EAP placed in Dr. Ellouise Newer office to be signed upon his return. Patient will need to be notified to pick up.

## 2017-12-04 NOTE — Telephone Encounter (Signed)
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 [phone].

## 2017-12-05 NOTE — Telephone Encounter (Signed)
Patient's mom, Alinda Money, is aware of form ready to pick up. She will come and grab it tomorrow.

## 2017-12-13 ENCOUNTER — Telehealth: Payer: Self-pay | Admitting: Allergy & Immunology

## 2017-12-13 NOTE — Telephone Encounter (Signed)
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 ti has to be  Put on paper each item. [phone].

## 2017-12-14 NOTE — Telephone Encounter (Signed)
Spoke with Leonard Fuller's mother and she will be coming by today after work to pick up the filled out forms.

## 2018-01-03 ENCOUNTER — Ambulatory Visit: Payer: BLUE CROSS/BLUE SHIELD | Admitting: Allergy & Immunology

## 2018-01-05 ENCOUNTER — Ambulatory Visit (HOSPITAL_COMMUNITY)
Admission: EM | Admit: 2018-01-05 | Discharge: 2018-01-05 | Disposition: A | Discharge status: Home / Self Care | Payer: BLUE CROSS/BLUE SHIELD | Attending: Family Medicine | Admitting: Family Medicine

## 2018-01-05 ENCOUNTER — Encounter (HOSPITAL_COMMUNITY): Payer: Self-pay | Admitting: Emergency Medicine

## 2018-01-05 ENCOUNTER — Other Ambulatory Visit: Payer: Self-pay

## 2018-01-05 DIAGNOSIS — M542 Cervicalgia: Secondary | ICD-10-CM

## 2018-01-05 DIAGNOSIS — J3089 Other allergic rhinitis: Secondary | ICD-10-CM | POA: Diagnosis not present

## 2018-01-05 DIAGNOSIS — J4540 Moderate persistent asthma, uncomplicated: Secondary | ICD-10-CM

## 2018-01-05 MED ORDER — MONTELUKAST SODIUM 10 MG PO TABS: 10.0000 mg | ORAL_TABLET | Freq: Every day | ORAL | 0 refills | Status: DC

## 2018-01-05 NOTE — Discharge Instructions (Addendum)
It was nice seeing you today. I am sorry you feel sick. You might be having seasonal allergy. Please avoid trigger, rest at home, take your rescue inhaler and Breo. Continue Allegra and start Singulair. See us as needed.

## 2018-01-05 NOTE — ED Triage Notes (Addendum)
Onset of symptoms 2 weeks ago.  Sore throat started last week.  Patient has chest congestion, productive cough.    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

## 2018-01-05 NOTE — ED Provider Notes (Signed)
[facility]    CSN: [id number] Arrival date & time: 01/05/18  1220     History   Chief Complaint Chief Complaint  Patient presents with  . Sore Throat    HPI Leonard Fuller is a 27 y.o. male.   The history is provided by the patient. No language interpreter was used.  Cough  Cough characteristics:  Productive (Cough x 2 weeks since he worked at [employer]. He had sore throat and it was already treated. 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 However, the symptoms started 2 weeks ago) Timing:  Intermittent Progression:  Waxing and waning Chronicity:  Recurrent (Normally at home he does not cough, but since he had been at the camp, he had been coughing) Smoker: no   Context: sick contacts   Context: not exposure to allergens, not smoke exposure and not upper respiratory infection   Context comment:  There is a farm at the camp but he is not around any animal.. 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 Worsened by:  Environmental changes Ineffective treatments: He went to Urgent Camp at [facility] and was treated for Sore throat which has improved. Associated symptoms: sinus congestion   Associated symptoms: no ear pain, no rash and no wheezing     Past Medical History:  Diagnosis Date  . Asthma     Patient Active Problem List   Diagnosis Date Noted  . Moderate persistent asthma without complication 12/21/2016  . Perennial allergic rhinitis 12/21/2016  . Adverse food reaction 12/21/2016    Past Surgical History:  Procedure Laterality Date  . LEG SURGERY         Home Medications    Prior to Admission medications   Medication Sig Start Date End Date Taking? Authorizing Provider  benzonatate (TESSALON PERLES) 100 MG capsule Take by mouth 3 (three) times daily  as needed for cough.   Yes [provider]  albuterol (PROAIR HFA) 108 (90 Base) MCG/ACT inhaler Inhale 2 puffs into the lungs every 6 (six) hours as needed for wheezing or shortness of breath. 11/22/17   Alfonse Spruce, MD  EPINEPHrine 0.3 mg/0.3 mL IJ SOAJ injection INJECT 0.3 MLS INTO THE MUSCLE ONCE 11/22/17   Alfonse Spruce, MD  fexofenadine (ALLEGRA) 180 MG tablet Take 1 tablet (180 mg total) by mouth daily. 11/22/17   Alfonse Spruce, MD  fluticasone furoate-vilanterol (BREO ELLIPTA) 100-25 MCG/INH AEPB inhale 1 puff daily 11/22/17   Alfonse Spruce, MD  mometasone (NASONEX) 50 MCG/ACT nasal spray Place 2 sprays into the nose daily. 11/22/17   Alfonse Spruce, MD    Family History Family History  Problem Relation Age of Onset  . Asthma Father   . Eczema Father   . Allergic rhinitis Sister   . Asthma Sister   . Food Allergy Sister     Social History Social History   Tobacco Use  . Smoking status: Never Smoker  . Smokeless tobacco: Never Used  Substance Use Topics  . Alcohol use: No    Alcohol/week: 0.0 oz  . Drug use: No     Allergies   Eggs or egg-derived products; Milk-related compounds; Other; Peanut oil; and Shellfish-derived products   Review of Systems Review of Systems  Constitutional: Negative.   HENT: Negative  for ear pain, trouble swallowing and voice change.   Respiratory: Negative for wheezing.   Cardiovascular: Negative.   Musculoskeletal: Positive for neck pain.  Skin: Negative for rash.  Neurological: Negative.   All other systems reviewed and are negative.  Neck Pain: C/O pain on the left side of his neck which started yesterday after he fell while playing with his friends. Pain is mild, worsens with movement or twisting his neck, no stiffness.  Physical Exam Triage Vital Signs ED Triage Vitals [01/05/18 1342]  Enc Vitals Group     BP      Pulse      Resp      Temp      Temp src      SpO2      Weight        Height      Head Circumference      Peak Flow      Pain Score 0     Pain Loc      Pain Edu?      Excl. in GC?    No data found.  Updated Vital Signs There were no vitals taken for this visit.  Visual Acuity Right Eye Distance:   Left Eye Distance:   Bilateral Distance:    Right Eye Near:   Left Eye Near:    Bilateral Near:     Physical Exam  Constitutional: He is oriented to person, place, and time. He appears well-developed and well-nourished.  HENT:  Head: Normocephalic.  Right Ear: Tympanic membrane and ear canal normal.  Left Ear: Tympanic membrane and ear canal normal.  Mouth/Throat: Uvula is midline, oropharynx is clear and moist and mucous membranes are normal. No oral lesions. No uvula swelling. No tonsillar exudate.  Eyes: Pupils are equal, round, and reactive to light. EOM are normal.  Neck: Normal range of motion. Neck rigidity present.    Cardiovascular: Normal rate, regular rhythm and normal heart sounds. Exam reveals no friction rub.  No murmur heard. Pulmonary/Chest: Effort normal and breath sounds normal. No stridor. No respiratory distress. He has no wheezes. He has no rhonchi. He has no rales.  Neurological: He is oriented to person, place, and time.  Nursing note and vitals reviewed.    UC Treatments / Results  Labs (all labs ordered are listed, but only abnormal results are displayed) Labs Reviewed - No data to display  EKG None  Radiology No results found.  Procedures Procedures (including critical care time)  Medications Ordered in UC Medications - No data to display  Initial Impression / Assessment and Plan / UC Course  I have reviewed the triage vital signs and the nursing notes.  Pertinent labs & imaging results that were available during my care of the patient were reviewed by me and considered in my medical decision making (see chart for details).  Clinical Course as of Jan 06 1423  Sat Jan 05, 2018  1422 Asthma and  Environmental allergy. No asthma flare. Continue home Asthme meds. On Allegra and Flonase at home for allergy. I added Singulair. Avoidance of trigger recommended. F/U soon if symptoms worsens.   [KE]  1423 Neck sprain. Ibuprofen prn pain recommended. May massage neck as well. F/U as needed.   [KE]    Clinical Course User Index [KE] Doreene ElandEniola, Leatta Alewine T, MD    Perennial allergic rhinitis  Moderate persistent asthma, unspecified whether complicated  Neck pain   Final Clinical Impressions(s) / UC Diagnoses   Final diagnoses:  None   Discharge Instructions   None    ED Prescriptions    None     Controlled Substance Prescriptions [place] Controlled Substance Registry consulted? Not Applicable   Doreene Eland, MD 01/05/18 1424

## 2018-01-17 ENCOUNTER — Encounter: Payer: Self-pay | Admitting: Allergy & Immunology

## 2018-01-17 ENCOUNTER — Ambulatory Visit: Payer: BLUE CROSS/BLUE SHIELD | Admitting: Allergy & Immunology

## 2018-01-17 VITALS — BP 130/80 | HR 94 | Temp 98.3°F | Resp 20 | Ht 71.0 in | Wt 274.4 lb

## 2018-01-17 DIAGNOSIS — J3089 Other allergic rhinitis: Secondary | ICD-10-CM

## 2018-01-17 DIAGNOSIS — J4540 Moderate persistent asthma, uncomplicated: Secondary | ICD-10-CM | POA: Diagnosis not present

## 2018-01-17 DIAGNOSIS — T781XXD Other adverse food reactions, not elsewhere classified, subsequent encounter: Secondary | ICD-10-CM | POA: Diagnosis not present

## 2018-01-17 MED ORDER — MOMETASONE FUROATE 50 MCG/ACT NA SUSP: 2.0000 | Freq: Every day | NASAL | 5 refills | Status: DC

## 2018-01-17 MED ORDER — FLUTICASONE FUROATE-VILANTEROL 100-25 MCG/INH IN AEPB: INHALATION_SPRAY | RESPIRATORY_TRACT | 5 refills | Status: DC

## 2018-01-17 MED ORDER — BENZONATATE 100 MG PO CAPS
100.0000 mg | ORAL_CAPSULE | Freq: Three times a day (TID) | ORAL | 3 refills | Status: DC | PRN
Start: 2018-01-17 — End: 2020-01-22

## 2018-01-17 MED ORDER — ALBUTEROL SULFATE HFA 108 (90 BASE) MCG/ACT IN AERS: 2.0000 | INHALATION_SPRAY | Freq: Four times a day (QID) | RESPIRATORY_TRACT | 1 refills | Status: DC | PRN

## 2018-01-17 MED ORDER — FEXOFENADINE HCL 180 MG PO TABS: 180.0000 mg | ORAL_TABLET | Freq: Every day | ORAL | 5 refills | Status: DC

## 2018-01-17 MED ORDER — MONTELUKAST SODIUM 10 MG PO TABS: 10.0000 mg | ORAL_TABLET | Freq: Every day | ORAL | 5 refills | Status: DC

## 2018-01-17 MED ORDER — EPINEPHRINE 0.3 MG/0.3ML IJ SOAJ: INTRAMUSCULAR | 0 refills | Status: DC

## 2018-01-17 NOTE — Progress Notes (Signed)
FOLLOW UP  Date of Service/Encounter:  01/17/18   Assessment:   Moderate persistent asthma without complication- better control with improved compliance  Perennial and seasonal allergic rhinitis (grasses, weeds, trees, dust mite, cat, dog, molds)  Adverse food reaction(peanuts, tree nuts, shellfish, milk, egg)   Asthma Reportables: Severity:moderate persistent Risk:low Control:well controlled     Plan/Recommendations:   1. Moderate persistent asthma, uncomplicated - Lung testing looked great today.  - We will not make any changes since you are doing so well.  - I would add on the montelukast during the summer to see if this helps with the hoarseness. - Daily controller medication(s): Breo 100/25 one inhalation once daily - Rescue medications: ProAir 4 puffs every 4-6 hours as needed - Asthma control goals:  * Full participation in all desired activities (may need albuterol before activity) * Albuterol use two time or less a week on average (not counting use with activity) * Cough interfering with sleep two time or less a month * Oral steroids no more than once a year * No hospitalizations  2. Perennial allergic rhinitis - Continue with Nasonex two sprays per nostril daily as needed.  - Continue with Allegra one tablet daily as needed.  - You can use montelukast WITH the Allegra (they work on different pathways in the allergic response).   3. Adverse food reaction (peanut, tree nuts, egg, shellfish, milk) - Continue to avoid these foods, but we can do food challenges in the future if you would like.  - I have tried time and time again to convince him to go through food challenges to get rid of these food allergies, but he has been quite resistant to this.   4. Return in about 1 year (around 01/18/2019).  Subjective:   Leonard Fuller is a 27 y.o. male presenting today for follow up of  Chief Complaint  Patient presents with  . Follow-up    Refills  needed    Leonard Fuller has a history of the following: Patient Active Problem List   Diagnosis Date Noted  . Moderate persistent asthma without complication 12/21/2016  . Perennial allergic rhinitis 12/21/2016  . Adverse food reaction 12/21/2016    History obtained from: chart review and patient and his delightful mother.  Leonard Fuller [facility] Primary Care Provider is [name], MD.     [name] is a 27 y.o. male presenting for a follow up visit.  I last saw Leonard Fuller in May 2019.  At that time, his lung testing looked excellent.  We continued him on Breo 100/25 mcg 1 inhalation once daily.  He has a history of allergic rhinitis, we continue Nasonex 2 sprays per nostril daily as needed and Allegra 1 tablet daily.  He has a history of multiple adverse food reactions.  He avoids peanut, tree nuts, eggs, shellfish, and milk.  His testing has been largely reassuring aside from tree nuts.  I have recommended food challenges on multiple occasions, but he and his father are very hesitant to consider this.  In the interim, he was seen in urgent care at the end of June for complications of allergic rhinitis.  It seems that montelukast was added to his regimen. Mom reports that she has not picked up this medication.  She is confused why he would need to take this medication with the Allegra.   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 While he is at camp, he has found ways to  deal with his exposures to limit his symptoms.  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  He also uses a mask whenever he is indoors. He has been fairly successful with these avoidance measures, and he goes home on the weekends to recover even further.   Mom reports that he has had hoarsness during the summer program, but overall this is improving even with the start of the montelukast. He needs refills on everything today.   Per remains on the Breo one puff once daily. He is very  happy with how well it is working for him. Leonard Fuller's asthma has been well controlled. He has not required rescue medication, experienced nocturnal awakenings due to lower respiratory symptoms, nor have activities of daily living been limited. He has required no Emergency Department or Urgent Care visits for his asthma. He has required zero courses of systemic steroids for asthma exacerbations since the last visit. ACT score today is 15, indicating subpar asthma symptom control.   We did fill out a form and write a letter at the last visit to help him get into a particular dorm. However, apparently this did not work and they need more documentation about his situation causes more anxiety. Mom is going to talk to a provider who did a 504 plan for him to fill out additional forms for him.   Otherwise, there have been no changes to his past medical history, surgical history, family history, or social history.    Review of Systems: a 14-point review of systems is pertinent for what is mentioned in HPI.  Otherwise, all other systems were negative. Constitutional: negative other than that listed in the HPI Eyes: negative other than that listed in the HPI Ears, nose, mouth, throat, and face: negative other than that listed in the HPI Respiratory: negative other than that listed in the HPI Cardiovascular: negative other than that listed in the HPI Gastrointestinal: negative other than that listed in the HPI Genitourinary: negative other than that listed in the HPI Integument: negative other than that listed in the HPI Hematologic: negative other than that listed in the HPI Musculoskeletal: negative other than that listed in the HPI Neurological: negative other than that listed in the HPI Allergy/Immunologic: negative other than that listed in the HPI    Objective:   Blood pressure 130/80, pulse 94, temperature 98.3 F (36.8 C), temperature source Oral, resp. rate 20, height 5\' 11"  (1.803 m), weight  274 lb 6.4 oz (124.5 kg), SpO2 97 %. Body mass index is 38.27 kg/m.   Physical Exam:  General: Alert, interactive, in no acute distress. Pleasant male. Very personable.  Eyes: No conjunctival injection bilaterally, no discharge on the right, no discharge on the left and no Horner-Trantas dots present. PERRL bilaterally. EOMI without pain. No photophobia.  Ears: Right TM pearly gray with normal light reflex, Left TM pearly gray with normal light reflex, Right TM intact without perforation and Left TM intact without perforation.  Nose/Throat: External nose within normal limits, nasal crease present and septum midline. Turbinates markedly edematous with clear discharge. Posterior oropharynx erythematous with cobblestoning in the posterior oropharynx. Tonsils 2+ without exudates.  Tongue without thrush. Lungs: Clear to auscultation without wheezing, rhonchi or rales. No increased work of breathing. CV: Normal S1/S2. No murmurs. Capillary refill <2 seconds.  Skin: Warm and dry, without lesions or rashes. Neuro:   Grossly intact. No focal deficits appreciated. Responsive to questions.  Diagnostic studies:   Spirometry: results normal (  FEV1: 3.27/81%, FVC: 4.09/85%, FEV1/FVC: 80%).    Spirometry consistent with normal pattern.  Allergy Studies: none      Malachi BondsJoel Yostin Malacara, MD  [facility]

## 2018-01-17 NOTE — Patient Instructions (Addendum)
1. Moderate persistent asthma, uncomplicated - Lung testing looked great today.  - We will not make any changes since you are doing so well.  - I would add on the montelukast during the summer to see if this helps with the hoarseness. - Daily controller medication(s): Breo 100/25 one inhalation once daily - Rescue medications: ProAir 4 puffs every 4-6 hours as needed - Asthma control goals:  * Full participation in all desired activities (may need albuterol before activity) * Albuterol use two time or less a week on average (not counting use with activity) * Cough interfering with sleep two time or less a month * Oral steroids no more than once a year * No hospitalizations  2. Perennial allergic rhinitis - Continue with Nasonex two sprays per nostril daily as needed.  - Continue with Allegra one tablet daily as needed.  - You can use montelukast WITH the Allegra (they work on different pathways in the allergic response).   3. Adverse food reaction (peanut, tree nuts, egg, shellfish, milk) - Continue to avoid these foods, but we can do food challenges in the future if you would like.   4. Return in about 1 year (around 01/18/2019).   Please inform us of any Emergency Department visits, hospitalizations, or changes in symptoms. Call us before going to the ED for breathing or allergy symptoms since we might be able to fit you in for a sick visit. Feel free to contact us anytime with any questions, problems, or concerns.  It was a pleasure to see you and your family again today! Love me some Reino KentHood time!   Websites that have reliable patient information: 1. American Academy of Asthma, Allergy, and Immunology: www.aaaai.org 2. Food Allergy Research and Education (FARE): foodallergy.org 3. Mothers of Asthmatics: http://www.asthmacommunitynetwork.org 4. American College of Allergy, Asthma, and Immunology: MissingWeapons.cawww.acaai.org   Make sure you are registered to vote! If you have moved or changed any  of your contact information, you will need to get this updated before voting!

## 2018-01-18 ENCOUNTER — Encounter: Payer: Self-pay | Admitting: Allergy & Immunology

## 2018-02-03 ENCOUNTER — Other Ambulatory Visit: Payer: Self-pay

## 2018-02-03 ENCOUNTER — Encounter (HOSPITAL_COMMUNITY): Payer: Self-pay | Admitting: Emergency Medicine

## 2018-02-03 ENCOUNTER — Emergency Department (HOSPITAL_COMMUNITY)
Admission: EM | Admit: 2018-02-03 | Discharge: 2018-02-03 | Disposition: A | Discharge status: Home / Self Care | Payer: BLUE CROSS/BLUE SHIELD | Attending: Emergency Medicine | Admitting: Emergency Medicine

## 2018-02-03 ENCOUNTER — Emergency Department (HOSPITAL_COMMUNITY): Payer: BLUE CROSS/BLUE SHIELD

## 2018-02-03 DIAGNOSIS — J4540 Moderate persistent asthma, uncomplicated: Secondary | ICD-10-CM | POA: Diagnosis not present

## 2018-02-03 DIAGNOSIS — L089 Local infection of the skin and subcutaneous tissue, unspecified: Secondary | ICD-10-CM

## 2018-02-03 DIAGNOSIS — M79644 Pain in right finger(s): Secondary | ICD-10-CM | POA: Diagnosis present

## 2018-02-03 MED ORDER — CLINDAMYCIN HCL 150 MG PO CAPS: 300.0000 mg | ORAL_CAPSULE | Freq: Three times a day (TID) | ORAL | 0 refills | Status: AC

## 2018-02-03 NOTE — ED Notes (Signed)
Pt on the hallway unable to sign at this time for dc instructions.

## 2018-02-03 NOTE — ED Triage Notes (Signed)
Pt got a thorn in his right thumb a week ago at [place] he works.  After care from the RN at camp and the finger developing puss from the injury, he came to the ED.  He believes there is still a piece in the finger.

## 2018-02-03 NOTE — ED Provider Notes (Signed)
[facility] EMERGENCY DEPARTMENT Provider Note  CSN: [id number] Arrival date & time: 02/03/18 0037  Chief Complaint(s) object in finger  HPI Leonard Fuller is a 27 y.o. male who presents with right thumb pain.  Patient reports that he believes he had a splinter embedded 1 week ago.  Following that the finger became swollen and they noted purulent discharge drained several days ago.  Since that time the finger swelling and redness has improved.  Patient believes he still has splinter in the finger.  He endorses mild pain that is exacerbated with palpation.  Able to range the finger.  No other alleviating or aggravating factors.  He denies any fevers or chills.  Denies any other trauma.  Denies any other physical complaints  HPI  Past Medical History Past Medical History:  Diagnosis Date  . Asthma   . Eczema    Patient Active Problem List   Diagnosis Date Noted  . Moderate persistent asthma without complication 12/21/2016  . Perennial allergic rhinitis 12/21/2016  . Adverse food reaction 12/21/2016   Home Medication(s) Prior to Admission medications   Medication Sig Start Date End Date Taking? Authorizing Provider  albuterol (PROAIR HFA) 108 (90 Base) MCG/ACT inhaler Inhale 2 puffs into the lungs every 6 (six) hours as needed for wheezing or shortness of breath. 01/17/18   Alfonse Spruce, MD  benzonatate (TESSALON PERLES) 100 MG capsule Take 1 capsule (100 mg total) by mouth 3 (three) times daily as needed for cough. 01/17/18   Alfonse Spruce, MD  clindamycin (CLEOCIN) 150 MG capsule Take 2 capsules (300 mg total) by mouth 3 (three) times daily for 5 days. 02/03/18 02/08/18  Nira Conn, MD  EPINEPHrine 0.3 mg/0.3 mL IJ SOAJ injection INJECT 0.3 MLS INTO THE MUSCLE ONCE 01/17/18   Alfonse Spruce, MD  fexofenadine (ALLEGRA) 180 MG tablet Take 1 tablet (180 mg total) by mouth daily. 01/17/18   Alfonse Spruce, MD  fluticasone  furoate-vilanterol (BREO ELLIPTA) 100-25 MCG/INH AEPB inhale 1 puff daily 01/17/18   Alfonse Spruce, MD  mometasone (NASONEX) 50 MCG/ACT nasal spray Place 2 sprays into the nose daily. 01/17/18   Alfonse Spruce, MD  montelukast (SINGULAIR) 10 MG tablet Take 1 tablet (10 mg total) by mouth at bedtime. 01/17/18   Alfonse Spruce, MD                                                                                                                                    Past Surgical History Past Surgical History:  Procedure Laterality Date  . LEG SURGERY     Family History Family History  Problem Relation Age of Onset  . Asthma Father   . Eczema Father   . Allergic rhinitis Sister   . Asthma Sister   . Food Allergy Sister     Social History Social History   Tobacco Use  .  Smoking status: Never Smoker  . Smokeless tobacco: Never Used  Substance Use Topics  . Alcohol use: No    Alcohol/week: 0.0 oz  . Drug use: No   Allergies Eggs or egg-derived products; Milk-related compounds; Other; Peanut oil; and Shellfish-derived products  Review of Systems Review of Systems As noted in HPI Physical Exam Vital Signs  I have reviewed the triage vital signs BP 116/78 (BP Location: Left Arm)   Pulse 73   Temp 98.1 F (36.7 C) (Oral)   Resp 15   Ht 5\' 10"  (1.778 m)   Wt 124.3 kg (274 lb)   SpO2 97%   BMI 39.31 kg/m   Physical Exam  Constitutional: He is oriented to person, place, and time. He appears well-developed and well-nourished. No distress.  HENT:  Head: Normocephalic and atraumatic.  Right Ear: External ear normal.  Left Ear: External ear normal.  Nose: Nose normal.  Mouth/Throat: Mucous membranes are normal. No trismus in the jaw.  Eyes: Conjunctivae and EOM are normal. No scleral icterus.  Neck: Normal range of motion and phonation normal.  Cardiovascular: Normal rate and regular rhythm.  Pulmonary/Chest: Effort normal. No stridor. No respiratory distress.   Abdominal: He exhibits no distension.  Musculoskeletal: Normal range of motion. He exhibits no edema.       Hands: Neurological: He is alert and oriented to person, place, and time.  Skin: He is not diaphoretic.  Psychiatric: He has a normal mood and affect. His behavior is normal.  Vitals reviewed.   ED Results and Treatments Labs (all labs ordered are listed, but only abnormal results are displayed) Labs Reviewed - No data to display                                                                                                                       EKG  EKG Interpretation  Date/Time:    Ventricular Rate:    PR Interval:    QRS Duration:   QT Interval:    QTC Calculation:   R Axis:     Text Interpretation:        Radiology Dg Finger Thumb Right  Result Date: 02/03/2018 CLINICAL DATA:  Foreign object in finger splinter 1 week ago EXAM: RIGHT THUMB 2+V COMPARISON:  None. FINDINGS: No fracture or malalignment. There is no radiopaque foreign body identified. IMPRESSION: No radiopaque foreign body is visible. Electronically Signed   By: Jasmine PangKim  Fujinaga M.D.   On: 02/03/2018 01:53   Pertinent labs & imaging results that were available during my care of the patient were reviewed by me and considered in my medical decision making (see chart for details).  Medications Ordered in ED Medications - No data to display  Procedures Procedures  (including critical care time)  Medical Decision Making / ED Course I have reviewed the nursing notes for this encounter and the patient's prior records (if available in EHR or on provided paperwork).    I was able to express a small amount of purulence from the wound.  Exploration did not reveal evidence of retained splinter.  No evidence suggesting flexor tenosynovitis.  Plain film will obtain in triage revealed no  acute fractures or dislocations.  No obvious radiopaque foreign body.  Recommended warm soaks.  Will prescribe short course of antibiotics.  The patient appears reasonably screened and/or stabilized for discharge and I doubt any other medical condition or other [facility] requiring further screening, evaluation, or treatment in the ED at this time prior to discharge.  The patient is safe for discharge with strict return precautions.     Final Clinical Impression(s) / ED Diagnoses Final diagnoses:  Finger infection    Disposition: Discharge  Condition: Good  I have discussed the results, Dx and Tx plan with the patient who expressed understanding and agree(s) with the plan. Discharge instructions discussed at great length. The patient was given strict return precautions who verbalized understanding of the instructions. No further questions at time of discharge.    ED Discharge Orders        Ordered    clindamycin (CLEOCIN) 150 MG capsule  3 times daily     02/03/18 0536       Follow Up: [name], MD [address] [place] [address] [phone]  Schedule an appointment as soon as possible for a visit  in 5-7 days, For wound re-check     This chart was dictated using voice recognition software.  Despite best efforts to proofread,  errors can occur which can change the documentation meaning.   Nira Conn, MD 02/03/18 [phone]

## 2018-04-14 ENCOUNTER — Other Ambulatory Visit: Payer: Self-pay | Admitting: Internal Medicine

## 2018-09-18 ENCOUNTER — Ambulatory Visit: Payer: BLUE CROSS/BLUE SHIELD | Admitting: Allergy

## 2019-02-26 ENCOUNTER — Encounter: Payer: BLUE CROSS/BLUE SHIELD | Admitting: Nurse Practitioner

## 2019-10-14 ENCOUNTER — Ambulatory Visit: Payer: BLUE CROSS/BLUE SHIELD | Admitting: Allergy & Immunology

## 2020-01-08 ENCOUNTER — Encounter: Payer: BLUE CROSS/BLUE SHIELD | Admitting: Internal Medicine

## 2020-01-19 ENCOUNTER — Ambulatory Visit: Payer: 59 | Admitting: Nurse Practitioner

## 2020-01-19 ENCOUNTER — Other Ambulatory Visit: Payer: Self-pay

## 2020-01-19 ENCOUNTER — Encounter: Payer: Self-pay | Admitting: Nurse Practitioner

## 2020-01-19 VITALS — BP 118/80 | HR 82 | Temp 97.7°F | Ht 70.0 in | Wt 249.2 lb

## 2020-01-19 DIAGNOSIS — Z0000 Encounter for general adult medical examination without abnormal findings: Secondary | ICD-10-CM | POA: Diagnosis not present

## 2020-01-19 DIAGNOSIS — E6609 Other obesity due to excess calories: Secondary | ICD-10-CM

## 2020-01-19 DIAGNOSIS — Z6835 Body mass index (BMI) 35.0-35.9, adult: Secondary | ICD-10-CM

## 2020-01-19 DIAGNOSIS — Z1159 Encounter for screening for other viral diseases: Secondary | ICD-10-CM | POA: Diagnosis not present

## 2020-01-19 DIAGNOSIS — Z139 Encounter for screening, unspecified: Secondary | ICD-10-CM

## 2020-01-19 DIAGNOSIS — H6123 Impacted cerumen, bilateral: Secondary | ICD-10-CM

## 2020-01-19 DIAGNOSIS — J4540 Moderate persistent asthma, uncomplicated: Secondary | ICD-10-CM

## 2020-01-19 NOTE — Patient Instructions (Addendum)

## 2020-01-19 NOTE — Progress Notes (Signed)
° °Established Patient Office Visit ° °Subjective:  °Patient ID: Leonard Fuller, male    DOB: 02/24/1991  Age: 29 y.o. MRN: [id number] ° °CC:  °Chief Complaint  °Patient presents with  °• Annual Exam  ° ° °HPI °Leonard Fuller presents for his annual physical exam. He is eating lots of whole foods, grilled and roasting his foods. He eats red meats in moderation and cutting back on his sweets. He has stopped drinking soda and he drinks about 48 ounces of water and uses chlorophyll in his water. He takes a multivitamin and tumeric. He is doing cardio 30 minutes per day on a tread mill. He is not sexually active and he does not drink alcohol or does any illegal drugs. He has asthma and he reports that it is well controlled which is being managed by the allergist. He does check for testicular nodules in the shower. He sees the eye doctor and dentist yearly. He has not had the COVID vaccine and is still hesitant. ° ° ° ° ° ° °Past Medical History:  °Diagnosis Date  °• Asthma   °• Eczema   ° ° °Past Surgical History:  °Procedure Laterality Date  °• LEG SURGERY    ° ° °Family History  °Problem Relation Age of Onset  °• Asthma Father   °• Eczema Father   °• Allergic rhinitis Sister   °• Asthma Sister   °• Food Allergy Sister   ° ° °Social History  ° °Socioeconomic History  °• Marital status: Single  °  Spouse name: Not on file  °• Number of children: Not on file  °• Years of education: Not on file  °• Highest education level: Not on file  °Occupational History  °• Not on file  °Tobacco Use  °• Smoking status: Never Smoker  °• Smokeless tobacco: Never Used  °Vaping Use  °• Vaping Use: Never used  °Substance and Sexual Activity  °• Alcohol use: No  °  Alcohol/week: 0.0 standard drinks  °• Drug use: No  °• Sexual activity: Not on file  °Other Topics Concern  °• Not on file  °Social History Narrative  °• Not on file  ° °Social Determinants of Health  ° °Financial Resource Strain:   °• Difficulty of Paying Living Expenses:   °Food  Insecurity:   °• Worried About Running Out of Food in the Last Year:   °• Ran Out of Food in the Last Year:   °Transportation Needs:   °• Lack of Transportation (Medical):   °• Lack of Transportation (Non-Medical):   °Physical Activity:   °• Days of Exercise per Week:   °• Minutes of Exercise per Session:   °Stress:   °• Feeling of Stress :   °Social Connections:   °• Frequency of Communication with Friends and Family:   °• Frequency of Social Gatherings with Friends and Family:   °• Attends Religious Services:   °• Active Member of Clubs or Organizations:   °• Attends Club or Organization Meetings:   °• Marital Status:   °Intimate Partner Violence:   °• Fear of Current or Ex-Partner:   °• Emotionally Abused:   °• Physically Abused:   °• Sexually Abused:   ° ° °Outpatient Medications Prior to Visit  °Medication Sig Dispense Refill  °• albuterol (PROAIR HFA) 108 (90 Base) MCG/ACT inhaler Inhale 2 puffs into the lungs every 6 (six) hours as needed for wheezing or shortness of breath. 1 Inhaler 1  °• EPINEPHrine 0.3 mg/0.3   mL IJ SOAJ injection INJECT 0.3 MLS INTO THE MUSCLE ONCE 1 Device 0  °• fexofenadine (ALLEGRA) 180 MG tablet Take 1 tablet (180 mg total) by mouth daily. 30 tablet 5  °• fluticasone furoate-vilanterol (BREO ELLIPTA) 100-25 MCG/INH AEPB inhale 1 puff daily 60 each 5  °• fluticasone furoate-vilanterol (BREO ELLIPTA) 100-25 MCG/INH AEPB Inhale 1 puff into the lungs daily.    °• mometasone (NASONEX) 50 MCG/ACT nasal spray Place 2 sprays into the nose daily. 17 g 5  °• Vitamin D, Ergocalciferol, (DRISDOL) 50000 units CAPS capsule TAKE 1 CAPSULE BY MOUTH 2 TIMES A WEEK 24 capsule 0  °• benzonatate (TESSALON PERLES) 100 MG capsule Take 1 capsule (100 mg total) by mouth 3 (three) times daily as needed for cough. (Patient not taking: Reported on 01/19/2020) 20 capsule 3  °• montelukast (SINGULAIR) 10 MG tablet Take 1 tablet (10 mg total) by mouth at bedtime. (Patient not taking: Reported on 01/19/2020) 30  tablet 5  ° °No facility-administered medications prior to visit.  ° ° °Allergies  °Allergen Reactions  °• Eggs Or Egg-Derived Products Nausea And Vomiting  °• Milk-Related Compounds Other (See Comments)  °• Other Other (See Comments) and Nausea And Vomiting  °• Peanut Oil Nausea And Vomiting  °• Shellfish-Derived Products Other (See Comments)  ° ° °ROS °Review of Systems  °Constitutional: Negative.   °HENT: Negative.   °Eyes: Negative.   °Respiratory: Negative.   °Cardiovascular: Negative.   °Gastrointestinal: Negative.   °Endocrine: Negative.   °Genitourinary: Negative.   °Musculoskeletal: Negative.   °Allergic/Immunologic: Negative.   °Neurological: Negative.   °Hematological: Negative.   °Psychiatric/Behavioral: Negative.   ° ° °  °Objective:  °  °Physical Exam °Constitutional:   °   General: He is not in acute distress. °   Appearance: Normal appearance. He is obese.  °HENT:  °   Head: Normocephalic and atraumatic.  °   Right Ear: Tympanic membrane and external ear normal. There is impacted cerumen.  °   Left Ear: Tympanic membrane and external ear normal. There is impacted cerumen.  °   Nose:  °   Comments: Deferred - masked °   Mouth/Throat:  °   Comments: Deferred - masked °Eyes:  °   Extraocular Movements: Extraocular movements intact.  °   Conjunctiva/sclera: Conjunctivae normal.  °   Pupils: Pupils are equal, round, and reactive to light.  °Neck:  °   Vascular: No carotid bruit.  °Cardiovascular:  °   Rate and Rhythm: Normal rate and regular rhythm.  °   Pulses: Normal pulses.  °   Heart sounds: Normal heart sounds. No murmur heard.  °No friction rub.  °Pulmonary:  °   Effort: Pulmonary effort is normal. No respiratory distress.  °   Breath sounds: Normal breath sounds. No wheezing.  °Abdominal:  °   General: Abdomen is flat. Bowel sounds are normal. There is no distension.  °   Palpations: Abdomen is soft. There is no mass.  °Musculoskeletal:     °   General: No swelling or tenderness. Normal range of  motion.  °   Cervical back: Normal range of motion. No rigidity or tenderness.  °Lymphadenopathy:  °   Cervical: No cervical adenopathy.  °Skin: °   General: Skin is warm and dry.  °   Capillary Refill: Capillary refill takes less than 2 seconds.  °Neurological:  °   General: No focal deficit present.  °   Mental Status: He is alert   and oriented to person, place, and time.  °Psychiatric:     °   Mood and Affect: Mood normal.     °   Behavior: Behavior normal.     °   Thought Content: Thought content normal.     °   Judgment: Judgment normal.  ° ° ° °BP 118/80 (BP Location: Left Arm, Patient Position: Sitting, Cuff Size: Normal)    Pulse 82    Temp 97.7 °F (36.5 °C) (Oral)    Ht 5' 10" (1.778 m)    Wt 249 lb 3.2 oz (113 kg)    BMI 35.76 kg/m²  °Wt Readings from Last 3 Encounters:  °01/19/20 249 lb 3.2 oz (113 kg)  °02/03/18 274 lb (124.3 kg)  °01/17/18 274 lb 6.4 oz (124.5 kg)  ° ° ° °Health Maintenance Due  °Topic Date Due  °• HIV Screening  Never done  °• TETANUS/TDAP  Never done  ° ° °There are no preventive care reminders to display for this patient. ° °No results found for: TSH °No results found for: WBC, HGB, HCT, MCV, PLT °No results found for: NA, K, CHLORIDE, CO2, GLUCOSE, BUN, CREATININE, BILITOT, ALKPHOS, AST, ALT, PROT, ALBUMIN, CALCIUM, ANIONGAP, EGFR, GFR °No results found for: CHOL °No results found for: HDL °No results found for: LDLCALC °No results found for: TRIG °No results found for: CHOLHDL °No results found for: HGBA1C ° °  °Assessment & Plan:  ° °1. Encounter for general health examination °• Behavior modifications discussed and diet history reviewed.   °• Pt will continue to exercise regularly and modify diet with low GI, plant based foods and decrease intake of processed foods.  °• Recommend intake of daily multivitamin, Vitamin D, and calcium.  °• Recommend for preventive screenings, as well as recommend immunizations that include TDAP (he will provide a copy of his TDAP) °- CMP14+EGFR °-  Lipid panel °- CBC ° °2. Encounter for hepatitis C screening test for low risk patient °· Will check Hepatitis C screening due to recent recommendations to screen all adults 18 years and older °- Hepatitis C antibody ° °3. Encounter for screening ° °- HIV antibody (with reflex) ° °4. Class 2 obesity due to excess calories without serious comorbidity with body mass index (BMI) of 35.0 to 35.9 in adult °· Chronic °· Discussed healthy diet and regular exercise options  °· Encouraged to exercise at least 150 minutes per week with 2 days of strength training °· Good sleep habits and regular bowel movements are important ° °5. Moderate persistent asthma without complication °· Being followed by allergist who he is seeing this week ° °6. Bilateral impacted cerumen °· Will have him to use 1/2 water and 1/2 peroxide to cleanse ears °· If has difficulty with hearing he will need to come back to have his ears flushed.  ° ° ° °Janece Moore, FNP °

## 2020-01-19 NOTE — Progress Notes (Deleted)
This visit occurred during the SARS-CoV-2 public health emergency.  Safety protocols were in place, including screening questions prior to the visit, additional usage of staff PPE, and extensive cleaning of exam room while observing appropriate contact time as indicated for disinfecting solutions.  Subjective:     Patient ID: Leonard Fuller , male    DOB: 08-Apr-1991 , 29 y.o.   MRN: [id number]   Chief Complaint  Patient presents with  . Annual Exam    HPI  HPI  Men's preventive visit. Patient Health Questionnaire (PHQ-2) is    Office Visit from 01/19/2020 in [facility]  PHQ-2 Total Score 0     Patient is on a *** diet. Exercise:***   Marital status: Single. Relevant history for alcohol use is:  Social History   Substance and Sexual Activity  Alcohol Use No  . Alcohol/week: 0.0 standard drinks   Relevant history for tobacco use is:  Social History   Tobacco Use  Smoking Status Never Smoker  Smokeless Tobacco Never Used   Past Medical History:  Diagnosis Date  . Asthma   . Eczema      Family History  Problem Relation Age of Onset  . Asthma Father   . Eczema Father   . Allergic rhinitis Sister   . Asthma Sister   . Food Allergy Sister      Current Outpatient Medications:  .  albuterol (PROAIR HFA) 108 (90 Base) MCG/ACT inhaler, Inhale 2 puffs into the lungs every 6 (six) hours as needed for wheezing or shortness of breath., Disp: 1 Inhaler, Rfl: 1 .  EPINEPHrine 0.3 mg/0.3 mL IJ SOAJ injection, INJECT 0.3 MLS INTO THE MUSCLE ONCE, Disp: 1 Device, Rfl: 0 .  fexofenadine (ALLEGRA) 180 MG tablet, Take 1 tablet (180 mg total) by mouth daily., Disp: 30 tablet, Rfl: 5 .  fluticasone furoate-vilanterol (BREO ELLIPTA) 100-25 MCG/INH AEPB, inhale 1 puff daily, Disp: 60 each, Rfl: 5 .  fluticasone furoate-vilanterol (BREO ELLIPTA) 100-25 MCG/INH AEPB, Inhale 1 puff into the lungs daily., Disp: , Rfl:  .  mometasone (NASONEX) 50 MCG/ACT nasal spray,  Place 2 sprays into the nose daily., Disp: 17 g, Rfl: 5 .  Vitamin D, Ergocalciferol, (DRISDOL) 50000 units CAPS capsule, TAKE 1 CAPSULE BY MOUTH 2 TIMES A WEEK, Disp: 24 capsule, Rfl: 0 .  benzonatate (TESSALON PERLES) 100 MG capsule, Take 1 capsule (100 mg total) by mouth 3 (three) times daily as needed for cough. (Patient not taking: Reported on 01/19/2020), Disp: 20 capsule, Rfl: 3 .  montelukast (SINGULAIR) 10 MG tablet, Take 1 tablet (10 mg total) by mouth at bedtime. (Patient not taking: Reported on 01/19/2020), Disp: 30 tablet, Rfl: 5   Allergies  Allergen Reactions  . Eggs Or Egg-Derived Products Nausea And Vomiting  . Milk-Related Compounds Other (See Comments)  . Other Other (See Comments) and Nausea And Vomiting  . Peanut Oil Nausea And Vomiting  . Shellfish-Derived Products Other (See Comments)     Review of Systems   Today's Vitals   01/19/20 1134  BP: 118/80  Pulse: 82  Temp: 97.7 F (36.5 C)  TempSrc: Oral  Weight: 249 lb 3.2 oz (113 kg)  Height: 5\' 10"  (1.778 m)  PainSc: 0-No pain   Body mass index is 35.76 kg/m.   Objective:  Physical Exam      Assessment And Plan:     1. Encounter for general health examination ***      ***  , FNP  Jeanell Sparrow, FNP, have reviewed all documentation for this visit. The documentation on 01/19/20 for the exam, diagnosis, procedures, and orders are all accurate and complete.  THE PATIENT IS ENCOURAGED TO PRACTICE SOCIAL DISTANCING DUE TO THE COVID-19 PANDEMIC.

## 2020-01-20 LAB — CMP14+EGFR
ALT: 13 IU/L (ref 0–44)
AST: 15 IU/L (ref 0–40)
Albumin/Globulin Ratio: 1.8 (ref 1.2–2.2)
Albumin: 5.1 g/dL (ref 4.1–5.2)
Alkaline Phosphatase: 82 IU/L (ref 48–121)
BUN/Creatinine Ratio: 18 (ref 9–20)
BUN: 22 mg/dL — ABNORMAL HIGH (ref 6–20)
Bilirubin Total: <0.2 mg/dL (ref 0.0–1.2)
CO2: 23 mmol/L (ref 20–29)
Calcium: 9.5 mg/dL (ref 8.7–10.2)
Chloride: 100 mmol/L (ref 96–106)
Creatinine, Ser: 1.2 mg/dL (ref 0.76–1.27)
GFR calc Af Amer: 96 mL/min/{1.73_m2} (ref >59)
GFR calc non Af Amer: 83 mL/min/{1.73_m2} (ref >59)
Globulin, Total: 2.9 g/dL (ref 1.5–4.5)
Glucose: 74 mg/dL (ref 65–99)
Potassium: 4.9 mmol/L (ref 3.5–5.2)
Sodium: 141 mmol/L (ref 134–144)
Total Protein: 8 g/dL (ref 6.0–8.5)

## 2020-01-20 LAB — CBC
Hematocrit: 45.4 % (ref 37.5–51.0)
Hemoglobin: 14.6 g/dL (ref 13.0–17.7)
MCH: 29.1 pg (ref 26.6–33.0)
MCHC: 32.2 g/dL (ref 31.5–35.7)
MCV: 91 fL (ref 79–97)
Platelets: 299 10*3/uL (ref 150–450)
RBC: 5.01 x10E6/uL (ref 4.14–5.80)
RDW: 13.8 % (ref 11.6–15.4)
WBC: 6.6 10*3/uL (ref 3.4–10.8)

## 2020-01-20 LAB — LIPID PANEL
Chol/HDL Ratio: 2.8 ratio (ref 0.0–5.0)
Cholesterol, Total: 131 mg/dL (ref 100–199)
HDL: 46 mg/dL (ref >39)
LDL Chol Calc (NIH): 70 mg/dL (ref 0–99)
Triglycerides: 72 mg/dL (ref 0–149)
VLDL Cholesterol Cal: 15 mg/dL (ref 5–40)

## 2020-01-20 LAB — HIV ANTIBODY (ROUTINE TESTING W REFLEX): HIV Screen 4th Generation wRfx: NONREACTIVE

## 2020-01-20 LAB — HEPATITIS C ANTIBODY: Hep C Virus Ab: <0.1 s/co ratio (ref 0.0–0.9)

## 2020-01-22 ENCOUNTER — Ambulatory Visit (INDEPENDENT_AMBULATORY_CARE_PROVIDER_SITE_OTHER): Payer: 59 | Admitting: Allergy & Immunology

## 2020-01-22 ENCOUNTER — Encounter: Payer: Self-pay | Admitting: Allergy & Immunology

## 2020-01-22 ENCOUNTER — Other Ambulatory Visit: Payer: Self-pay

## 2020-01-22 VITALS — BP 112/74 | HR 80 | Ht 71.0 in | Wt 245.0 lb

## 2020-01-22 DIAGNOSIS — T7800XD Anaphylactic reaction due to unspecified food, subsequent encounter: Secondary | ICD-10-CM

## 2020-01-22 DIAGNOSIS — J4540 Moderate persistent asthma, uncomplicated: Secondary | ICD-10-CM

## 2020-01-22 DIAGNOSIS — J3089 Other allergic rhinitis: Secondary | ICD-10-CM | POA: Diagnosis not present

## 2020-01-22 NOTE — Progress Notes (Signed)
FOLLOW UP  Date of Service/Encounter:  01/22/20   Assessment:   Moderate persistent asthma without complication- better control with improved compliance  Perennialand seasonalallergic rhinitis(grasses, weeds, trees, dust mite, cat, dog, molds)   Adverse food reaction(peanuts, tree nuts, shellfish, milk, egg)  Recent weight loss (intentional)  COVID-19 vaccine hesitant   Plan/Recommendations:   1. Moderate persistent asthma, uncomplicated - Lung testing looked great today.  - Decrease the Breo to one puff once daily. - Daily controller medication(s): Breo 100/25 one inhalation once daily - Rescue medications: ProAir 4 puffs every 4-6 hours as needed - Asthma control goals:  * Full participation in all desired activities (may need albuterol before activity) * Albuterol use two time or less a week on average (not counting use with activity) * Cough interfering with sleep two time or less a month * Oral steroids no more than once a year * No hospitalizations  2. Perennial allergic rhinitis - Continue with Nasonex two sprays per nostril daily as needed.  - Continue with Allegra one tablet daily as needed.  - You can use montelukast WITH the Allegra (they work on different pathways in the allergic response).   3. Adverse food reaction (peanut, tree nuts, egg, shellfish, milk) - Continue to avoid these foods, but we can do food challenges in the future if you would like.  - I have tried time and time again to convince him to go through food challenges to get rid of these food allergies, but he has been quite resistant to this.   4. Return in about 1 year (around 01/21/2021). This can be an in-person, a virtual Webex or a telephone follow up visit.   Subjective:   Leonard Fuller is a 29 y.o. male presenting today for follow up of  Chief Complaint  Patient presents with   Asthma    doing good. no issues. need medications refilled.     BRETON BERNS has a  history of the following: Patient Active Problem List   Diagnosis Date Noted   Moderate persistent asthma without complication 12/21/2016   Perennial allergic rhinitis 12/21/2016   Adverse food reaction 12/21/2016    History obtained from: chart review and patient.  Leonard Fuller is a 29 y.o. male presenting for a follow up visit.  He was last seen in July 2019.  At that time, his lung testing looked great.  We did not make any medication changes.  Continue with Breo 100/25 mcg 1 puff once daily as well as albuterol as needed.  For his allergic rhinitis, would continue with Nasonex as well as Allegra and montelukast.  We recommended continued avoidance of peanuts, tree nuts, egg, shellfish, and milk.  We did discuss food challenges, but he was resistant to this.  Since last visit,  Asthma/Respiratory Symptom History: He has done better since losing the weight. He remains on the [facility] but is doing one puff twice daily. He has not needed steroids at all since the last visit. The last time that he needed his rescue inhaler was at camp when he was outdoors.   Allergic Rhinitis Symptom History: He remains on Nasonex as well as Allegra. He is also on the montelukast. He has not needed antibiotics in quite some time.   Food Allergy Symptom History: He continues to avoid peanuts, tree nuts, egg, shellfish (he does tolerate fin fish without a problem as well as calamari), and milk. He does tolerate cake without a problem.   He has lost 10  pounds since the last time I saw him. He has done this mostly through dietary changes, including intermittent fasting.   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 He is not ready for the COVID19 vaccine at this point in time.   Otherwise, there have been no changes to his past medical history, surgical history, family history, or social history.    Review of Systems  Constitutional: Negative.  Negative for chills, fever,  malaise/fatigue and weight loss.  HENT: Positive for congestion. Negative for ear discharge, ear pain and sinus pain.        Positive for postnasal drip.  Eyes: Negative for pain, discharge and redness.  Respiratory: Negative for cough, sputum production, shortness of breath and wheezing.   Cardiovascular: Negative.  Negative for chest pain and palpitations.  Gastrointestinal: Negative for abdominal pain, constipation, diarrhea, heartburn, nausea and vomiting.  Skin: Negative.  Negative for itching and rash.  Neurological: Negative for dizziness and headaches.  Endo/Heme/Allergies: Positive for environmental allergies. Does not bruise/bleed easily.       Objective:   Blood pressure 112/74, pulse 80, height 5\' 11"  (1.803 m), weight 245 lb (111.1 kg), SpO2 97 %. Body mass index is 34.17 kg/m.   Physical Exam:  Physical Exam Constitutional:      Appearance: He is well-developed.     Comments: Smiling and pleasant male. Cooperative with the exam.   HENT:     Head: Normocephalic and atraumatic.     Right Ear: Tympanic membrane, ear canal and external ear normal.     Left Ear: Tympanic membrane and ear canal normal.     Nose: No nasal deformity, septal deviation, mucosal edema or rhinorrhea.     Right Sinus: No maxillary sinus tenderness or frontal sinus tenderness.     Left Sinus: No maxillary sinus tenderness or frontal sinus tenderness.     Mouth/Throat:     Mouth: Mucous membranes are not pale and not dry.     Pharynx: Uvula midline.  Eyes:     General:        Right eye: No discharge.        Left eye: No discharge.     Conjunctiva/sclera: Conjunctivae normal.     Right eye: Right conjunctiva is not injected. No chemosis.    Left eye: Left conjunctiva is not injected. No chemosis.    Pupils: Pupils are equal, round, and reactive to light.  Cardiovascular:     Rate and Rhythm: Normal rate and regular rhythm.     Heart sounds: Normal heart sounds.  Pulmonary:     Effort:  Pulmonary effort is normal. No tachypnea, accessory muscle usage or respiratory distress.     Breath sounds: Normal breath sounds. No wheezing, rhonchi or rales.  Chest:     Chest wall: No tenderness.  Lymphadenopathy:     Cervical: No cervical adenopathy.  Skin:    Coloration: Skin is not pale.     Findings: No abrasion, erythema, petechiae or rash. Rash is not papular, urticarial or vesicular.  Neurological:     Mental Status: He is alert.      Diagnostic studies:    Spirometry: results normal (FEV1: 3.88/97%, FVC: 5.00/105%, FEV1/FVC: 78%).    Spirometry consistent with normal pattern.   Allergy Studies: none             01-03-1983, MD  [facility]

## 2020-01-22 NOTE — Patient Instructions (Addendum)
1. Moderate persistent asthma, uncomplicated - Lung testing looked great today.  - Decrease the Breo to one puff once daily. - Daily controller medication(s): Breo 100/25 one inhalation once daily - Rescue medications: ProAir 4 puffs every 4-6 hours as needed - Asthma control goals:  * Full participation in all desired activities (may need albuterol before activity) * Albuterol use two time or less a week on average (not counting use with activity) * Cough interfering with sleep two time or less a month * Oral steroids no more than once a year * No hospitalizations  2. Perennial allergic rhinitis - Continue with Nasonex two sprays per nostril daily as needed.  - Continue with Allegra one tablet daily as needed.  - You can use montelukast WITH the Allegra (they work on different pathways in the allergic response).   3. Adverse food reaction (peanut, tree nuts, egg, shellfish, milk) - Continue to avoid these foods, but we can do food challenges in the future if you would like.  - I have tried time and time again to convince him to go through food challenges to get rid of these food allergies, but he has been quite resistant to this.   4. Return in about 1 year (around 01/21/2021). This can be an in-person, a virtual Webex or a telephone follow up visit.   Please inform us of any Emergency Department visits, hospitalizations, or changes in symptoms. Call us before going to the ED for breathing or allergy symptoms since we might be able to fit you in for a sick visit. Feel free to contact us anytime with any questions, problems, or concerns.  It was a pleasure to see you again today!  Websites that have reliable patient information: 1. American Academy of Asthma, Allergy, and Immunology: www.aaaai.org 2. Food Allergy Research and Education (FARE): foodallergy.org 3. Mothers of Asthmatics: http://www.asthmacommunitynetwork.org 4. American College of Allergy, Asthma, and Immunology:  www.acaai.org   COVID-19 Vaccine Information can be found at: PodExchange.nl For questions related to vaccine distribution or appointments, please email [email or web address] or call [phone].     Like [place] on [employer] and Instagram for our latest updates!        Make sure you are registered to vote! If you have moved or changed any of your contact information, you will need to get this updated before voting!  In some cases, you MAY be able to register to vote online: [email or web address]

## 2020-01-26 ENCOUNTER — Other Ambulatory Visit: Payer: Self-pay

## 2020-01-26 ENCOUNTER — Other Ambulatory Visit: Payer: Self-pay | Admitting: Allergy & Immunology

## 2020-01-26 MED ORDER — ALBUTEROL SULFATE HFA 108 (90 BASE) MCG/ACT IN AERS: 2.0000 | INHALATION_SPRAY | Freq: Four times a day (QID) | RESPIRATORY_TRACT | 1 refills | Status: DC | PRN

## 2020-01-26 MED ORDER — FEXOFENADINE HCL 180 MG PO TABS: 180.0000 mg | ORAL_TABLET | Freq: Every day | ORAL | 5 refills | Status: DC

## 2020-01-26 MED ORDER — MOMETASONE FUROATE 50 MCG/ACT NA SUSP: 2.0000 | Freq: Every day | NASAL | 5 refills | Status: DC

## 2020-01-26 MED ORDER — BREO ELLIPTA 100-25 MCG/INH IN AEPB: INHALATION_SPRAY | RESPIRATORY_TRACT | 5 refills | Status: DC

## 2020-01-26 MED ORDER — MOMETASONE FUROATE 50 MCG/ACT NA SUSP: 2.0000 | Freq: Every day | NASAL | 5 refills | Status: AC

## 2020-01-26 MED ORDER — FEXOFENADINE HCL 180 MG PO TABS: 180.0000 mg | ORAL_TABLET | Freq: Every day | ORAL | 5 refills | Status: AC

## 2020-01-26 NOTE — Telephone Encounter (Signed)
Prior auth for mometasone nasal spray has been submitted.

## 2020-01-26 NOTE — Telephone Encounter (Signed)
Patient's mother called and states that patient needs all of his medications refilled for the year.   Please advise.

## 2020-01-26 NOTE — Telephone Encounter (Signed)
Refills sent to pharmacy. Patient was notified.  

## 2020-01-28 NOTE — Telephone Encounter (Signed)
Prior Berkley Harvey was denied. I checked through epic and covered alternatives are: astelin, nasacort.

## 2020-01-29 MED ORDER — TRIAMCINOLONE ACETONIDE 55 MCG/ACT NA AERO
1.0000 | INHALATION_SPRAY | Freq: Two times a day (BID) | NASAL | 5 refills | Status: DC
Start: 2020-01-29 — End: 2021-10-18

## 2020-01-29 NOTE — Addendum Note (Signed)
Addended by: Dub Mikes on: 01/29/2020 05:38 PM   Modules accepted: Orders

## 2020-01-29 NOTE — Telephone Encounter (Signed)
Let's do Nasacort one spray per nostril BID.  Malachi Bonds, MD [facility]

## 2020-01-29 NOTE — Telephone Encounter (Signed)
Prescription has been sent to the pharmacy. Left message for mom to call the office in regards to this medication change.

## 2020-02-02 NOTE — Telephone Encounter (Signed)
Called patients mother and she states he usually just takes Flonase over the counter but will try Nasacort if needed.

## 2020-06-16 IMAGING — CR DG FINGER THUMB 2+V*R*
3 series · 3 of 3 positions shown · non-contrast
Comparison: None.

CLINICAL DATA: Foreign object in finger splinter 1 week ago

EXAM:
RIGHT THUMB 2+V

[finger ap]
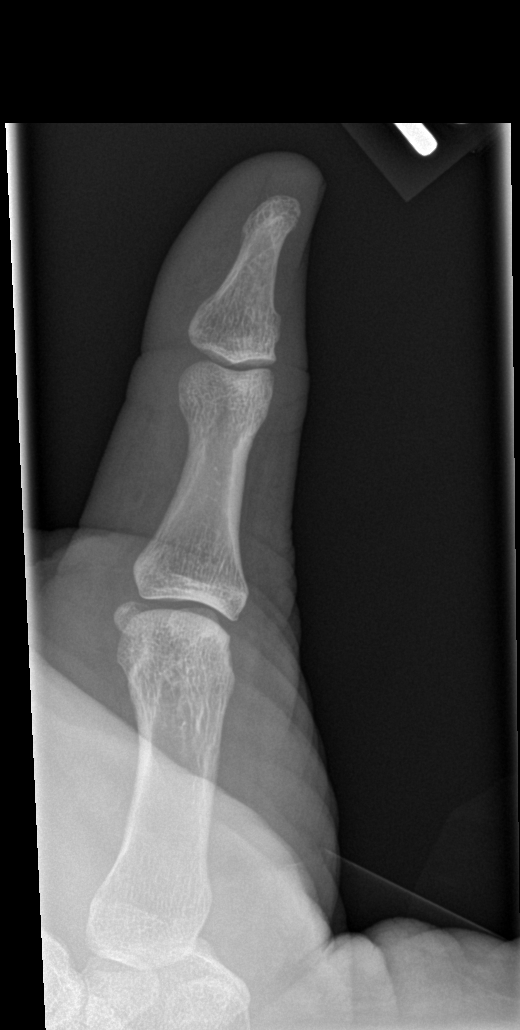

[finger obl]
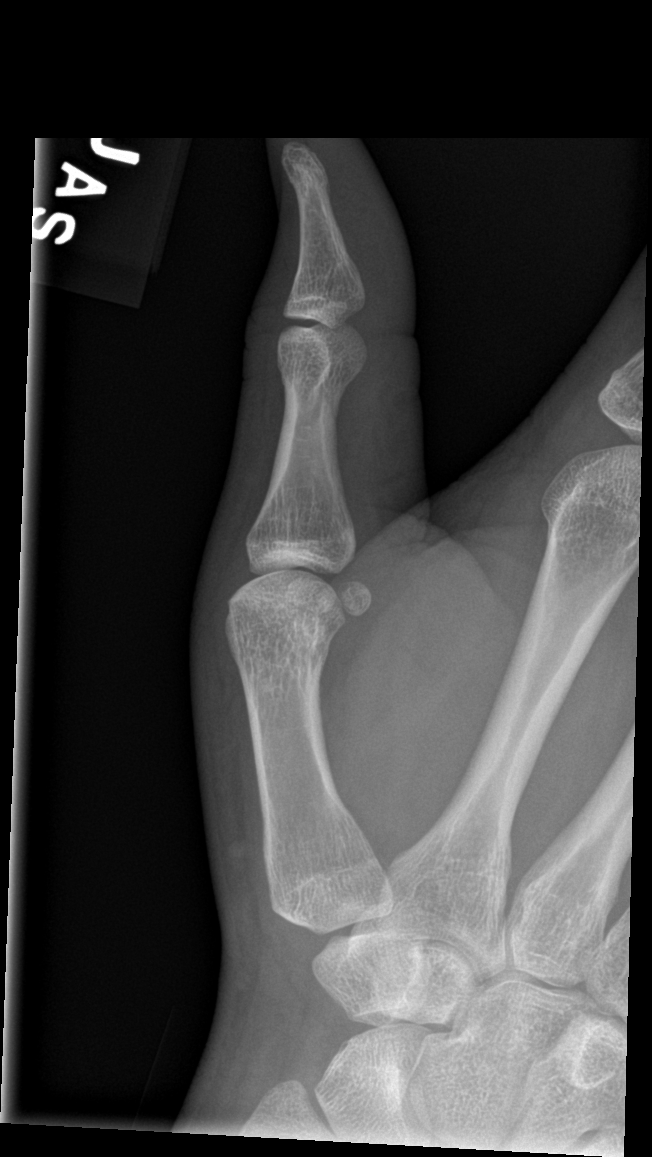

[finger lat]
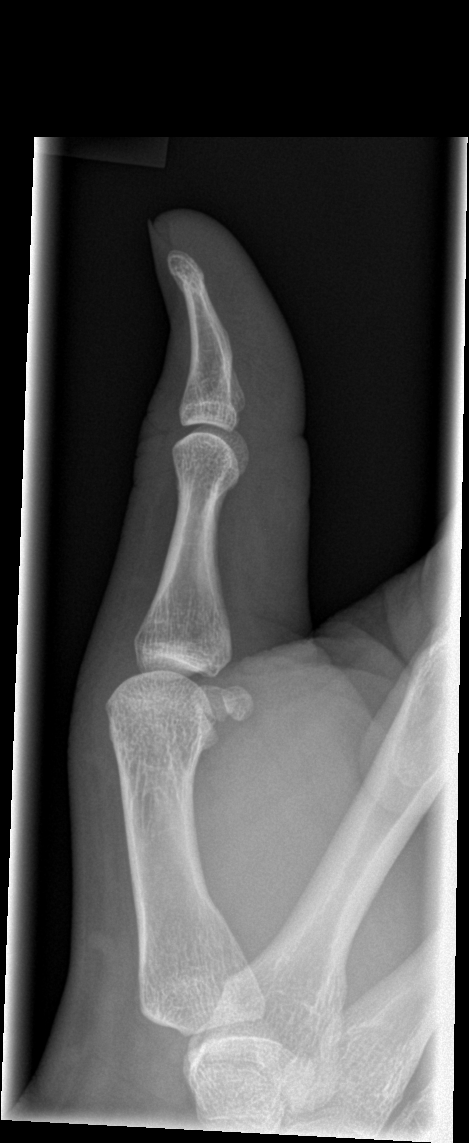

[3 of 3 positions shown; findings below may reference images not displayed]

FINDINGS: No fracture or malalignment. There is no radiopaque foreign body
identified.
IMPRESSION: No radiopaque foreign body is visible.

## 2020-06-22 ENCOUNTER — Other Ambulatory Visit: Payer: Self-pay

## 2020-06-22 MED ORDER — EPINEPHRINE 0.3 MG/0.3ML IJ SOAJ: INTRAMUSCULAR | 3 refills | Status: DC

## 2020-07-01 ENCOUNTER — Other Ambulatory Visit: Payer: Self-pay | Admitting: Allergy & Immunology

## 2021-01-20 ENCOUNTER — Encounter: Payer: 59 | Admitting: Nurse Practitioner

## 2021-01-25 ENCOUNTER — Other Ambulatory Visit: Payer: Self-pay | Admitting: Allergy & Immunology

## 2021-05-17 ENCOUNTER — Telehealth: Payer: Self-pay

## 2021-05-17 NOTE — Telephone Encounter (Signed)
I called the patient to let him know he needs an appointment for further refills. He was last seen July of 2021 and was due back July of 2022. His mailbox was full so I was not able to leave a message. I will try calling his mother tomorrow.

## 2021-05-18 ENCOUNTER — Other Ambulatory Visit: Payer: Self-pay

## 2021-05-18 MED ORDER — ALBUTEROL SULFATE HFA 108 (90 BASE) MCG/ACT IN AERS: 2.0000 | INHALATION_SPRAY | RESPIRATORY_TRACT | 0 refills | Status: AC | PRN

## 2021-05-18 MED ORDER — MONTELUKAST SODIUM 10 MG PO TABS: 10.0000 mg | ORAL_TABLET | Freq: Every day | ORAL | 0 refills | Status: DC

## 2021-05-18 MED ORDER — FLUTICASONE FUROATE-VILANTEROL 100-25 MCG/ACT IN AEPB: 1.0000 | INHALATION_SPRAY | Freq: Every day | RESPIRATORY_TRACT | 0 refills | Status: DC

## 2021-05-18 NOTE — Telephone Encounter (Signed)
Called patient and left a message on the home phone that I sent a one month supply of his medications and to call back to make an appointment for further refills.

## 2021-05-18 NOTE — Telephone Encounter (Signed)
Let's do one month supply until he can get in to see [place].   Malachi Bonds, MD [facility]

## 2021-09-27 ENCOUNTER — Encounter: Payer: Self-pay | Admitting: Allergy & Immunology

## 2021-09-27 ENCOUNTER — Other Ambulatory Visit: Payer: Self-pay

## 2021-09-27 ENCOUNTER — Ambulatory Visit (INDEPENDENT_AMBULATORY_CARE_PROVIDER_SITE_OTHER): Payer: BC Managed Care – PPO | Admitting: Allergy & Immunology

## 2021-09-27 VITALS — BP 118/80 | HR 95 | Temp 98.0°F | Resp 16 | Ht 69.49 in | Wt 275.2 lb

## 2021-09-27 DIAGNOSIS — J4540 Moderate persistent asthma, uncomplicated: Secondary | ICD-10-CM | POA: Diagnosis not present

## 2021-09-27 DIAGNOSIS — T7800XD Anaphylactic reaction due to unspecified food, subsequent encounter: Secondary | ICD-10-CM

## 2021-09-27 DIAGNOSIS — J3089 Other allergic rhinitis: Secondary | ICD-10-CM

## 2021-09-27 MED ORDER — FLUTICASONE FUROATE-VILANTEROL 100-25 MCG/ACT IN AEPB: 1.0000 | INHALATION_SPRAY | Freq: Every day | RESPIRATORY_TRACT | 11 refills | Status: DC

## 2021-09-27 MED ORDER — EPINEPHRINE 0.3 MG/0.3ML IJ SOAJ: INTRAMUSCULAR | 3 refills | Status: AC

## 2021-09-27 NOTE — Patient Instructions (Addendum)
1. Moderate persistent asthma, uncomplicated ?- Lung testing looked great today.  ?- We are not going to make any medication changes at this time.  ?- Daily controller medication(s): Breo 100/25 one inhalation once daily ?- Rescue medications: ProAir 4 puffs every 4-6 hours as needed ?- Asthma control goals:  ?* Full participation in all desired activities (may need albuterol before activity) ?* Albuterol use two time or less a week on average (not counting use with activity) ?* Cough interfering with sleep two time or less a month ?* Oral steroids no more than once a year ?* No hospitalizations ?  ?2. Perennial allergic rhinitis ?- Continue with nasal saline rinses as needed.  ?  ?3. Adverse food reaction (peanut, tree nuts, shellfish, milk) ?- GREAT job on introducing egg into your diet!  ?- Let us know when you want to introduce these other foods into your diet and we can do blood work first.  ?- EpiPen refilled today.  ?  ?4. Return in about 1 year (around 09/28/2022).  ? ? ?Please inform us of any Emergency Department visits, hospitalizations, or changes in symptoms. Call us before going to the ED for breathing or allergy symptoms since we might be able to fit you in for a sick visit. Feel free to contact us anytime with any questions, problems, or concerns. ? ?It was a pleasure to see you and your family again today! ? ?Websites that have reliable patient information: ?1. American Academy of Asthma, Allergy, and Immunology: www.aaaai.org ?2. Food Allergy Research and Education (FARE): foodallergy.org ?3. Mothers of Asthmatics: http://www.asthmacommunitynetwork.org ?4. [employer] of Allergy, Asthma, and Immunology: MissingWeapons.ca ? ? ?COVID-19 Vaccine Information can be found at: PodExchange.nl For questions related to vaccine distribution or appointments, please email [email or web address] or call [phone].  ? ?We realize that you might be  concerned about having an allergic reaction to the COVID19 vaccines. To help with that concern, WE ARE OFFERING THE COVID19 VACCINES IN OUR OFFICE! Ask the front desk for dates!  ? ? ? ??Like? [place] on Facebook and Instagram for our latest updates!  ?  ? ? ?A healthy democracy works best when [employer] participate! Make sure you are registered to vote! If you have moved or changed any of your contact information, you will need to get this updated before voting! ? ?In some cases, you MAY be able to register to vote online: [email or web address] ? ? ? ? ? ? ? ? ? ?

## 2021-09-27 NOTE — Progress Notes (Signed)
? ?FOLLOW UP ? ?Date of Service/Encounter:  09/27/21 ? ? ?Assessment:  ? ?Moderate persistent asthma without complication - better control with improved compliance ?  ?Perennial and seasonal allergic rhinitis (grasses, weeds, trees, dust mite, cat, dog, molds) ?   ?Adverse food reaction (peanuts, tree nuts, shellfish, milk, egg) ?  ?Recent weight loss (intentional) ?  ?COVID-19 vaccine hesitant ? ?Plan/Recommendations:  ? ?1. Moderate persistent asthma, uncomplicated ?- Lung testing looked great today.  ?- We are not going to make any medication changes at this time.  ?- Daily controller medication(s): Breo 100/25 one inhalation once daily ?- Rescue medications: ProAir 4 puffs every 4-6 hours as needed ?- Asthma control goals:  ?* Full participation in all desired activities (may need albuterol before activity) ?* Albuterol use two time or less a week on average (not counting use with activity) ?* Cough interfering with sleep two time or less a month ?* Oral steroids no more than once a year ?* No hospitalizations ?  ?2. Perennial allergic rhinitis ?- Continue with nasal saline rinses as needed.  ?  ?3. Adverse food reaction (peanut, tree nuts, shellfish, milk) ?- GREAT job on introducing egg into your diet!  ?- Let us know when you want to introduce these other foods into your diet and we can do blood work first.  ?- EpiPen refilled today.  ?  ?4. Return in about 1 year (around 09/28/2022).  ? ? ?Subjective:  ? ?Leonard Fuller is a 31 y.o. male presenting today for follow up of  ?Chief Complaint  ?Patient presents with  ? Follow-up  ? Asthma  ?  No flare ups  ? Allergic Rhinitis   ? ? ?Leonard Fuller has a history of the following: ?Patient Active Problem List  ? Diagnosis Date Noted  ? Moderate persistent asthma without complication 99991111  ? Perennial allergic rhinitis 12/21/2016  ? Adverse food reaction 12/21/2016  ? ? ?History obtained from: chart review and patient as well as his delightful mother, Leonard Fuller.   ? ?Leonard Fuller is a 31 y.o. male presenting for a follow up visit. He was last seen in July 2021. At that time, we continued with Breo one puff once daily. For his allergic rhinitis, we continued with Nasonex as well as Allegra and montelukast. He continued to avoid peanuts, tree nuts, egg, shellfish, and milk.  ? ?Since the last visit, he has done largely well.  ? ?Asthma/Respiratory Symptom History: He remains  on the Breo one puff once daily. He reports compliance with this, although I doubt it since I have not seen him in nearly two years. Regardless, he has done well without hospitalizations or ED visits. He has not been using his rescue inhaler at all for any wheezing or SOB episodes. Sleep is uninterrupted by wheezing or coughing. [name]'s asthma has been well controlled. He has not required rescue medication, experienced nocturnal awakenings due to lower respiratory symptoms, nor have activities of daily living been limited. He has required no Emergency Department or Urgent Care visits for his asthma. He has required zero courses of systemic steroids for asthma exacerbations since the last visit. ACT score today is 25, indicating excellent asthma symptom control.  ?  ?Allergic Rhinitis Symptom History: He has been using nasal saline rinses only. He is not using any antihistamines and has not needed any antibiotics for any sinus infections at all. All in all, he is doing very well.  ? ?Food Allergy Symptom History: He actually did finally introduce  egg into his diet. This took years of prodding and reassuring. He enjoys eggs now in all forms. He continues to avoid peanuts, tree nuts, shellfish, and milk.  He is somewhat open to introducing these back into his diet.  ? ?Otherwise, there have been no changes to his past medical history, surgical history, family history, or social history. ? ? ? ?Review of Systems  ?Constitutional: Negative.  Negative for chills, fever, malaise/fatigue and weight loss.  ?HENT:  Negative.  Negative for congestion, ear discharge, ear pain and sinus pain.   ?Eyes:  Negative for pain, discharge and redness.  ?Respiratory:  Negative for cough, sputum production, shortness of breath and wheezing.   ?Cardiovascular: Negative.  Negative for chest pain and palpitations.  ?Gastrointestinal:  Negative for abdominal pain, constipation, diarrhea, heartburn, nausea and vomiting.  ?Skin: Negative.  Negative for itching and rash.  ?Neurological:  Negative for dizziness and headaches.  ?Endo/Heme/Allergies:  Negative for environmental allergies. Does not bruise/bleed easily.  ?All other systems reviewed and are negative.  ? ? ? ?Objective:  ? ?Blood pressure 118/80, pulse 95, temperature 98 ?F (36.7 ?C), temperature source Temporal, resp. rate 16, height 5' 9.49" (1.765 m), weight 275 lb 3.2 oz (124.8 kg), SpO2 96 %. ?Body mass index is 40.07 kg/m?. ? ? ? ?Physical Exam ?Vitals reviewed.  ?Constitutional:   ?   Appearance: He is well-developed.  ?   Comments: Smiling and pleasant male. Cooperative with the exam.   ?HENT:  ?   Head: Normocephalic and atraumatic.  ?   Right Ear: Tympanic membrane, ear canal and external ear normal.  ?   Left Ear: Tympanic membrane, ear canal and external ear normal.  ?   Nose: No nasal deformity, septal deviation, mucosal edema or rhinorrhea.  ?   Right Turbinates: Enlarged and swollen.  ?   Left Turbinates: Enlarged and swollen.  ?   Right Sinus: No maxillary sinus tenderness or frontal sinus tenderness.  ?   Left Sinus: No maxillary sinus tenderness or frontal sinus tenderness.  ?   Mouth/Throat:  ?   Mouth: Mucous membranes are not pale and not dry.  ?   Pharynx: Uvula midline.  ?Eyes:  ?   General:     ?   Right eye: No discharge.     ?   Left eye: No discharge.  ?   Conjunctiva/sclera: Conjunctivae normal.  ?   Right eye: Right conjunctiva is not injected. No chemosis. ?   Left eye: Left conjunctiva is not injected. No chemosis. ?   Pupils: Pupils are equal, round, and  reactive to light.  ?Cardiovascular:  ?   Rate and Rhythm: Normal rate and regular rhythm.  ?   Heart sounds: Normal heart sounds.  ?Pulmonary:  ?   Effort: Pulmonary effort is normal. No tachypnea, accessory muscle usage or respiratory distress.  ?   Breath sounds: Normal breath sounds. No wheezing, rhonchi or rales.  ?   Comments: Moving air well in all lung fields. No increased work of breathing noted.  ?Chest:  ?   Chest wall: No tenderness.  ?Lymphadenopathy:  ?   Cervical: No cervical adenopathy.  ?Skin: ?   Coloration: Skin is not pale.  ?   Findings: No abrasion, erythema, petechiae or rash. Rash is not papular, urticarial or vesicular.  ?Neurological:  ?   Mental Status: He is alert.  ?Psychiatric:     ?   Behavior: Behavior is cooperative.  ?  ? ?Diagnostic  studies:   ? ?Spirometry: results normal (FEV1: 3.31/89%, FVC: 4.23/95%, FEV1/FVC: 78%).  ?  ?Spirometry consistent with normal pattern.  ? ?Allergy Studies: none ? ? ? ? ? ?  ?Salvatore Marvel, MD  ?[facility] ? ? ? ? ? ? ?

## 2021-10-18 ENCOUNTER — Ambulatory Visit (INDEPENDENT_AMBULATORY_CARE_PROVIDER_SITE_OTHER): Payer: BC Managed Care – PPO | Admitting: Nurse Practitioner

## 2021-10-18 ENCOUNTER — Encounter: Payer: Self-pay | Admitting: Nurse Practitioner

## 2021-10-18 VITALS — BP 122/86 | HR 83 | Temp 98.6°F | Ht 70.0 in | Wt 280.2 lb

## 2021-10-18 DIAGNOSIS — H6123 Impacted cerumen, bilateral: Secondary | ICD-10-CM

## 2021-10-18 DIAGNOSIS — Z6841 Body Mass Index (BMI) 40.0 and over, adult: Secondary | ICD-10-CM

## 2021-10-18 DIAGNOSIS — Z0000 Encounter for general adult medical examination without abnormal findings: Secondary | ICD-10-CM

## 2021-10-18 DIAGNOSIS — J4520 Mild intermittent asthma, uncomplicated: Secondary | ICD-10-CM | POA: Diagnosis not present

## 2021-10-18 LAB — CBC
Hematocrit: 43.3 % (ref 37.5–51.0)
Hemoglobin: 14.7 g/dL (ref 13.0–17.7)
MCH: 28.7 pg (ref 26.6–33.0)
MCHC: 33.9 g/dL (ref 31.5–35.7)
MCV: 84 fL (ref 79–97)
Platelets: 312 10*3/uL (ref 150–450)
RBC: 5.13 x10E6/uL (ref 4.14–5.80)
RDW: 13.2 % (ref 11.6–15.4)
WBC: 6.3 10*3/uL (ref 3.4–10.8)

## 2021-10-18 NOTE — Patient Instructions (Addendum)
Health Maintenance, Male ?Adopting a healthy lifestyle and getting preventive care are important in promoting health and wellness. Ask your health care provider about: ?The right schedule for you to have regular tests and exams. ?Things you can do on your own to prevent diseases and keep yourself healthy. ?What should I know about diet, weight, and exercise? ?Eat a healthy diet ? ?Eat a diet that includes plenty of vegetables, fruits, low-fat dairy products, and lean protein. ?Do not eat a lot of foods that are high in solid fats, added sugars, or sodium. ?Maintain a healthy weight ?Body mass index (BMI) is a measurement that can be used to identify possible weight problems. It estimates body fat based on height and weight. Your health care provider can help determine your BMI and help you achieve or maintain a healthy weight. ?Get regular exercise ?Get regular exercise. This is one of the most important things you can do for your health. Most adults should: ?Exercise for at least 150 minutes each week. The exercise should increase your heart rate and make you sweat (moderate-intensity exercise). ?Do strengthening exercises at least twice a week. This is in addition to the moderate-intensity exercise. ?Spend less time sitting. Even light physical activity can be beneficial. ?Watch cholesterol and blood lipids ?Have your blood tested for lipids and cholesterol at 29 years of age, then have this test every 5 years. ?You may need to have your cholesterol levels checked more often if: ?Your lipid or cholesterol levels are high. ?You are older than 29 years of age. ?You are at high risk for heart disease. ?What should I know about cancer screening? ?Many types of cancers can be detected early and may often be prevented. Depending on your health history and family history, you may need to have cancer screening at various ages. This may include screening for: ?Colorectal cancer. ?Prostate cancer. ?Skin cancer. ?Lung  cancer. ?What should I know about heart disease, diabetes, and high blood pressure? ?Blood pressure and heart disease ?High blood pressure causes heart disease and increases the risk of stroke. This is more likely to develop in people who have high blood pressure readings or are overweight. ?Talk with your health care provider about your target blood pressure readings. ?Have your blood pressure checked: ?Every 3-5 years if you are 42-43 years of age. ?Every year if you are 47 years old or older. ?If you are between the ages of 11 and 57 and are a current or former smoker, ask your health care provider if you should have a one-time screening for abdominal aortic aneurysm (AAA). ?Diabetes ?Have regular diabetes screenings. This checks your fasting blood sugar level. Have the screening done: ?Once every three years after age 8 if you are at a normal weight and have a low risk for diabetes. ?More often and at a younger age if you are overweight or have a high risk for diabetes. ?What should I know about preventing infection? ?Hepatitis B ?If you have a higher risk for hepatitis B, you should be screened for this virus. Talk with your health care provider to find out if you are at risk for hepatitis B infection. ?Hepatitis C ?Blood testing is recommended for: ?Everyone born from [birth year] through 1963. ?Anyone with known risk factors for hepatitis C. ?Sexually transmitted infections (STIs) ?You should be screened each year for STIs, including gonorrhea and chlamydia, if: ?You are sexually active and are younger than 29 years of age. ?You are older than 29 years of age and your  health care provider tells you that you are at risk for this type of infection. ?Your sexual activity has changed since you were last screened, and you are at increased risk for chlamydia or gonorrhea. Ask your health care provider if you are at risk. ?Ask your health care provider about whether you are at high risk for HIV. Your health care provider  may recommend a prescription medicine to help prevent HIV infection. If you choose to take medicine to prevent HIV, you should first get tested for HIV. You should then be tested every 3 months for as long as you are taking the medicine. ?Follow these instructions at home: ?Alcohol use ?Do not drink alcohol if your health care provider tells you not to drink. ?If you drink alcohol: ?Limit how much you have to 0-2 drinks a day. ?Know how much alcohol is in your drink. In the [place] one drink equals one 12 oz bottle of beer (355 mL), one 5 oz glass of wine (148 mL), or one 1? oz glass of hard liquor (44 mL). ?Lifestyle ?Do not use any products that contain nicotine or tobacco. These products include cigarettes, chewing tobacco, and vaping devices, such as e-cigarettes. If you need help quitting, ask your health care provider. ?Do not use street drugs. ?Do not share needles. ?Ask your health care provider for help if you need support or information about quitting drugs. ?General instructions ?Schedule regular health, dental, and eye exams. ?Stay current with your vaccines. ?Tell your health care provider if: ?You often feel depressed. ?You have ever been abused or do not feel safe at home. ?Summary ?Adopting a healthy lifestyle and getting preventive care are important in promoting health and wellness. ?Follow your health care provider's instructions about healthy diet, exercising, and getting tested or screened for diseases. ?Follow your health care provider's instructions on monitoring your cholesterol and blood pressure. ?This information is not intended to replace advice given to you by your health care provider. Make sure you discuss any questions you have with your health care provider. ?Document Revised: 11/15/2020 Document Reviewed: 11/15/2020 ?Elsevier Patient Education ? 2022 Elsevier Inc. ? ? ?Earwax Buildup, Adult ?The ears produce a substance called earwax that helps keep bacteria out of the ear and  protects the skin in the ear canal. Occasionally, earwax can build up in the ear and cause discomfort or hearing loss. ?What are the causes? ?This condition is caused by a buildup of earwax. Ear canals are self-cleaning. Ear wax is made in the outer part of the ear canal and generally falls out in small amounts over time. ?When the self-cleaning mechanism is not working, earwax builds up and can cause decreased hearing and discomfort. Attempting to clean ears with cotton swabs can push the earwax deep into the ear canal and cause decreased hearing and pain. ?What increases the risk? ?This condition is more likely to develop in people who: ?Clean their ears often with cotton swabs. ?Pick at their ears. ?Use earplugs or in-ear headphones often, or wear hearing aids. ?The following factors may also make you more likely to develop this condition: ?Being male. ?Being of older age. ?Naturally producing more earwax. ?Having narrow ear canals. ?Having earwax that is overly thick or sticky. ?Having excess hair in the ear canal. ?Having eczema. ?Being dehydrated. ?What are the signs or symptoms? ?Symptoms of this condition include: ?Reduced or muffled hearing. ?A feeling of fullness in the ear or feeling that the ear is plugged. ?Fluid coming from the ear. ?Ear  pain or an itchy ear. ?Ringing in the ear. ?Coughing. ?Balance problems. ?An obvious piece of earwax that can be seen inside the ear canal. ?How is this diagnosed? ?This condition may be diagnosed based on: ?Your symptoms. ?Your medical history. ?An ear exam. During the exam, your health care provider will look into your ear with an instrument called an otoscope. ?You may have tests, including a hearing test. ?How is this treated? ?This condition may be treated by: ?Using ear drops to soften the earwax. ?Having the earwax removed by a health care provider. The health care provider may: ?Flush the ear with water. ?Use an instrument that has a loop on the end  (curette). ?Use a suction device. ?Having surgery to remove the wax buildup. This may be done in severe cases. ?Follow these instructions at home: ? ?Take over-the-counter and prescription medicines only as told by your healt

## 2021-10-18 NOTE — Progress Notes (Signed)
?[employer] as a Education administrator for [employer], FNP.,have documented all relevant documentation on the behalf of Minette Brine, FNP,as directed by  Minette Brine, FNP while in the presence of Minette Brine, [place].  ?This visit occurred during the SARS-CoV-2 public health emergency.  Safety protocols were in place, including screening questions prior to the visit, additional usage of staff PPE, and extensive cleaning of exam room while observing appropriate contact time as indicated for disinfecting solutions. ? ?Subjective:  ?  ? Patient ID: [facility] , male    DOB: 05-Dec-1990 , 31 y.o.   MRN: [id number] ? ? ?Chief Complaint  ?Patient presents with  ? Annual Exam  ? ? ?HPI ? ?The patient is here today for a physical examination. 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 Single. No children ? ?Wt Readings from Last 3 Encounters: ?10/18/21 : 280 lb 3.2 oz (127.1 kg) ?09/27/21 : 275 lb 3.2 oz (124.8 kg) ?01/22/20 : 245 lb (111.1 kg) ? ? ? ? ?  ? ?Past Medical History:  ?Diagnosis Date  ? Asthma   ? Eczema   ?  ? ?Family History  ?Problem Relation Age of Onset  ? Asthma Father   ? Eczema Father   ? Allergic rhinitis Sister   ? Asthma Sister   ? Food Allergy Sister   ? ? ? ?Current Outpatient Medications:  ?  albuterol (PROAIR HFA) 108 (90 Base) MCG/ACT inhaler, Inhale 2 puffs into the lungs every 4 (four) hours as needed for wheezing or shortness of breath., Disp: 18 g, Rfl: 0 ?  EPINEPHrine 0.3 mg/0.3 mL IJ SOAJ injection, Use for life-threatening allergic reactions, Disp: 4 each, Rfl: 3 ?  fexofenadine (ALLEGRA) 180 MG tablet, Take 1 tablet (180 mg total) by mouth daily., Disp: 30 tablet, Rfl: 5 ?  fluticasone furoate-vilanterol (BREO ELLIPTA) 100-25 MCG/ACT AEPB, Inhale 1 puff into the lungs daily., Disp: 28 each, Rfl: 11 ?  mometasone (NASONEX) 50 MCG/ACT nasal spray, Place 2 sprays into the nose daily., Disp: 17 g, Rfl: 5 ?  sodium chloride (OCEAN) 0.65 % SOLN  nasal spray, Place 1 spray into both nostrils as needed for congestion. [place], Disp: , Rfl:   ? ?Allergies  ?Allergen Reactions  ? Eggs Or Egg-Derived Products Nausea And Vomiting  ? Milk-Related Compounds Other (See Comments)  ? Other Other (See Comments) and Nausea And Vomiting  ? Peanut Oil Nausea And Vomiting  ? Shellfish-Derived Products Other (See Comments)  ?  ? ?Men's preventive visit. Patient Health Questionnaire (PHQ-2) is  ?[place] Office Visit from 10/18/2021 in [facility]  ?PHQ-2 Total Score 0  ? ?  ?Patient is on a Regular diet; admits can do better. Exercising - 5000 steps daily.   Marital status: Single. Relevant history for alcohol use is:  ?Social History  ? ?Substance and Sexual Activity  ?Alcohol Use No  ? Alcohol/week: 0.0 standard drinks  ? ?Relevant history for tobacco use is:  ?Social History  ? ?Tobacco Use  ?Smoking Status Never  ?Smokeless Tobacco Never  ?.  ? ?Review of Systems  ?Constitutional: Negative.   ?HENT: Negative.    ?Eyes: Negative.   ?Respiratory: Negative.    ?Cardiovascular: Negative.   ?Gastrointestinal: Negative.   ?Endocrine: Negative.   ?Genitourinary: Negative.   ?Musculoskeletal: Negative.   ?Allergic/Immunologic: Negative.   ?Neurological: Negative.   ?Hematological: Negative.   ?Psychiatric/Behavioral: Negative.     ? ?Today's Vitals  ?  10/18/21 1435  ?BP: 122/86  ?Pulse: 83  ?Temp: 98.6 ?F (37 ?C)  ?Weight: 280 lb 3.2 oz (127.1 kg)  ?Height: _0  (1.778 m)  ? ?Body mass index is 40.2 kg/m?.  ?Wt Readings from Last 3 Encounters:  ?10/18/21 280 lb 3.2 oz (127.1 kg)  ?09/27/21 275 lb 3.2 oz (124.8 kg)  ?01/22/20 245 lb (111.1 kg)  ?  ?BP Readings from Last 3 Encounters:  ?10/18/21 122/86  ?09/27/21 118/80  ?01/22/20 112/74  ?  ?Objective:  ?Physical Exam ?Vitals reviewed.  ?Constitutional:   ?   Appearance: Normal appearance. He is obese.  ?HENT:  ?   Head: Normocephalic and atraumatic.  ?   Right Ear: Tympanic membrane, ear canal and  external ear normal. There is impacted cerumen.  ?   Left Ear: Tympanic membrane, ear canal and external ear normal. There is impacted cerumen.  ?Cardiovascular:  ?   Rate and Rhythm: Normal rate and regular rhythm.  ?   Pulses: Normal pulses.  ?   Heart sounds: Normal heart sounds. No murmur heard. ?Pulmonary:  ?   Effort: Pulmonary effort is normal. No respiratory distress.  ?   Breath sounds: Normal breath sounds.  ?Abdominal:  ?   General: Abdomen is flat. Bowel sounds are normal. There is no distension.  ?   Palpations: Abdomen is soft.  ?Genitourinary: ?   Prostate: Normal.  ?   Rectum: Guaiac result negative.  ?Musculoskeletal:     ?   General: Normal range of motion.  ?   Cervical back: Normal range of motion and neck supple.  ?Skin: ?   General: Skin is warm.  ?   Capillary Refill: Capillary refill takes less than 2 seconds.  ?Neurological:  ?   General: No focal deficit present.  ?   Mental Status: He is alert and oriented to person, place, and time.  ?Psychiatric:     ?   Mood and Affect: Mood normal.     ?   Behavior: Behavior normal.     ?   Thought Content: Thought content normal.     ?   Judgment: Judgment normal.  ?  ? ?   ?Assessment And Plan:  ?  ?1. Encounter for general health examination ?Behavior modifications discussed and diet history reviewed.   ?Pt will continue to exercise regularly and modify diet with low GI, plant based foods and decrease intake of processed foods.  ?Recommend intake of daily multivitamin, Vitamin D, and calcium.  ?Recommend for preventive screenings, as well as recommend immunizations that include influenza, TDAP ?- CBC ?- CMP14+EGFR ? ?2. Class 3 severe obesity due to excess calories without serious comorbidity with body mass index (BMI) of 40.0 to 44.9 in adult [facility]) ?Chronic ?Discussed healthy diet and regular exercise options  ?Encouraged to exercise at least 150 minutes per week with 2 days of strength training ? ?3. Mild intermittent asthma without  complication ?Comments: followed by Asthma specialist, takes Breo ? ?4. Bilateral impacted cerumen ?Comments: Removed cerumen with lighted curette with good results ? ? ? ?Patient was given opportunity to ask questions. Patient verbalized understanding of the plan and was able to repeat key elements of the plan. All questions were answered to their satisfaction.  ? ?Minette Brine, FNP  ? ?I, Minette Brine, FNP, have reviewed all documentation for this visit. The documentation on 10/18/21 for the exam, diagnosis, procedures, and orders are all accurate and complete.  ? ?THE PATIENT IS ENCOURAGED TO PRACTICE  SOCIAL DISTANCING DUE TO THE COVID-19 PANDEMIC.   ?

## 2021-10-19 LAB — CMP14+EGFR
ALT: 10 IU/L (ref 0–44)
AST: 14 IU/L (ref 0–40)
Albumin/Globulin Ratio: 1.6 (ref 1.2–2.2)
Albumin: 4.9 g/dL (ref 4.1–5.2)
Alkaline Phosphatase: 90 IU/L (ref 44–121)
BUN/Creatinine Ratio: 11 (ref 9–20)
BUN: 10 mg/dL (ref 6–20)
Bilirubin Total: 0.3 mg/dL (ref 0.0–1.2)
CO2: 27 mmol/L (ref 20–29)
Calcium: 9.8 mg/dL (ref 8.7–10.2)
Chloride: 103 mmol/L (ref 96–106)
Creatinine, Ser: 0.95 mg/dL (ref 0.76–1.27)
Globulin, Total: 3.1 g/dL (ref 1.5–4.5)
Glucose: 85 mg/dL (ref 70–99)
Potassium: 4.8 mmol/L (ref 3.5–5.2)
Sodium: 140 mmol/L (ref 134–144)
Total Protein: 8 g/dL (ref 6.0–8.5)
eGFR: 112 mL/min/{1.73_m2} (ref >59)

## 2022-08-23 ENCOUNTER — Telehealth: Payer: Self-pay

## 2022-08-23 NOTE — Telephone Encounter (Signed)
Received fax from [place] for Breo Ellipta 100-25 mcg/act PA.  KEY: [id number]  Forwarding message to PA Team to initiate PA.

## 2022-08-25 ENCOUNTER — Other Ambulatory Visit (HOSPITAL_COMMUNITY): Payer: Self-pay

## 2022-08-29 ENCOUNTER — Other Ambulatory Visit (HOSPITAL_COMMUNITY): Payer: Self-pay

## 2022-08-30 NOTE — Telephone Encounter (Signed)
Mom called to get status of prior authorization for [facility]. Mom states they were at the pharmacy trying to pick up prescription. Advised mom prior authorization had been approved. Mom spoke to pharmacist & asked them to try the insurance again. Pharmacist was able to do so and have medication covered. Mom expressed her gratitude and stated they are able to pick up prescription.

## 2022-08-30 NOTE — Telephone Encounter (Signed)
This is the information that has been received from the plan:

## 2022-09-28 ENCOUNTER — Other Ambulatory Visit: Payer: Self-pay | Admitting: *Deleted

## 2022-09-28 MED ORDER — BREO ELLIPTA 100-25 MCG/ACT IN AEPB
1.0000 | INHALATION_SPRAY | Freq: Every day | RESPIRATORY_TRACT | 5 refills | Status: AC
Start: 2022-09-28 — End: ?

## 2022-10-24 ENCOUNTER — Encounter: Payer: BC Managed Care – PPO | Admitting: Nurse Practitioner

## 2022-11-06 ENCOUNTER — Ambulatory Visit (INDEPENDENT_AMBULATORY_CARE_PROVIDER_SITE_OTHER): Payer: 59

## 2022-11-06 ENCOUNTER — Other Ambulatory Visit: Payer: Self-pay

## 2022-11-06 ENCOUNTER — Ambulatory Visit
Admission: EM | Admit: 2022-11-06 | Discharge: 2022-11-06 | Disposition: A | Discharge status: Home / Self Care | Payer: 59 | Attending: Internal Medicine | Admitting: Internal Medicine

## 2022-11-06 DIAGNOSIS — M79671 Pain in right foot: Secondary | ICD-10-CM | POA: Diagnosis not present

## 2022-11-06 DIAGNOSIS — M25571 Pain in right ankle and joints of right foot: Secondary | ICD-10-CM

## 2022-11-06 NOTE — ED Provider Notes (Signed)
[facility]    CSN: [id number] Arrival date & time: 11/06/22  1722      History   Chief Complaint Chief Complaint  Patient presents with   Ankle Injury    HPI ARY [name] is a 32 y.o. male.   Patient presents with right ankle and foot pain that occurred after an injury approximately 3 weeks ago.  Patient reports that he was walking at work and accidentally twisted his ankle over.  Has not been seen previously for injury.  Denies numbness or tingling.  Has taken ibuprofen for pain.   Ankle Injury    Past Medical History:  Diagnosis Date   Asthma    Eczema     Patient Active Problem List   Diagnosis Date Noted   Snoring 01/05/2017   Bilateral impacted cerumen 01/05/2017   Moderate persistent asthma without complication 12/21/2016   Perennial allergic rhinitis 12/21/2016   Adverse food reaction 12/21/2016    Past Surgical History:  Procedure Laterality Date   LEG SURGERY         Home Medications    Prior to Admission medications   Medication Sig Start Date End Date Taking? Authorizing Provider  albuterol (PROAIR HFA) 108 (90 Base) MCG/ACT inhaler Inhale 2 puffs into the lungs every 4 (four) hours as needed for wheezing or shortness of breath. 05/18/21   Alfonse Spruce, MD  BREO ELLIPTA 100-25 MCG/ACT AEPB Inhale 1 puff into the lungs daily. 09/28/22   Alfonse Spruce, MD  EPINEPHrine 0.3 mg/0.3 mL IJ SOAJ injection Use for life-threatening allergic reactions 09/27/21   Alfonse Spruce, MD  fexofenadine (ALLEGRA) 180 MG tablet Take 1 tablet (180 mg total) by mouth daily. 01/26/20   Alfonse Spruce, MD  mometasone (NASONEX) 50 MCG/ACT nasal spray Place 2 sprays into the nose daily. 01/26/20   Alfonse Spruce, MD  sodium chloride (OCEAN) 0.65 % SOLN nasal spray Place 1 spray into both nostrils as needed for congestion. Equ[place]    [provider]    Family History Family History  Problem Relation Age of Onset    Asthma Father    Eczema Father    Allergic rhinitis Sister    Asthma Sister    Food Allergy Sister     Social History Social History   Tobacco Use   Smoking status: Never   Smokeless tobacco: Never  Vaping Use   Vaping Use: Never used  Substance Use Topics   Alcohol use: No    Alcohol/week: 0.0 standard drinks of alcohol   Drug use: No     Allergies   Egg-derived products, Milk-related compounds, Other, Peanut oil, and Shellfish-derived products   Review of Systems Review of Systems Per HPI  Physical Exam Triage Vital Signs ED Triage Vitals  Enc Vitals Group     BP 11/06/22 1850 122/78     Pulse Rate 11/06/22 1850 91     Resp 11/06/22 1850 16     Temp 11/06/22 1850 98 F (36.7 C)     Temp Source 11/06/22 1850 Oral     SpO2 11/06/22 1850 97 %     Weight --      Height --      Head Circumference --      Peak Flow --      Pain Score 11/06/22 1849 7     Pain Loc --      Pain Edu? --      Excl. in GC? --  No data found.  Updated Vital Signs BP 122/78 (BP Location: Right Arm)   Pulse 91   Temp 98 F (36.7 C) (Oral)   Resp 16   SpO2 97%   Visual Acuity Right Eye Distance:   Left Eye Distance:   Bilateral Distance:    Right Eye Near:   Left Eye Near:    Bilateral Near:     Physical Exam Constitutional:      General: He is not in acute distress.    Appearance: Normal appearance. He is not toxic-appearing or diaphoretic.  HENT:     Head: Normocephalic and atraumatic.  Eyes:     Extraocular Movements: Extraocular movements intact.     Conjunctiva/sclera: Conjunctivae normal.  Pulmonary:     Effort: Pulmonary effort is normal.  Musculoskeletal:     Comments: Patient has tenderness to palpation to anterior medial right ankle that extends slightly into dorsal surface of foot.  Patient also has tenderness to palpation to dorsal surface of foot at proximal portion directly below ankle at 4th an 5th metatarsals.  No discoloration, lacerations,  abrasions noted.  Patient can wiggle toes.  Capillary refill and pulses intact.  Patient is neurovascularly intact.  Neurological:     General: No focal deficit present.     Mental Status: He is alert and oriented to person, place, and time. Mental status is at baseline.  Psychiatric:        Mood and Affect: Mood normal.        Behavior: Behavior normal.        Thought Content: Thought content normal.        Judgment: Judgment normal.      UC Treatments / Results  Labs (all labs ordered are listed, but only abnormal results are displayed) Labs Reviewed - No data to display  EKG   Radiology DG Foot Complete Right  Result Date: 11/06/2022 CLINICAL DATA:  Foot pain EXAM: RIGHT FOOT COMPLETE - 3+ VIEW COMPARISON:  None Available. FINDINGS: There is no evidence of fracture or dislocation. There is no evidence of arthropathy or other focal bone abnormality. Soft tissues are unremarkable. IMPRESSION: Negative. Electronically Signed   By: Deatra Robinson M.D.   On: 11/06/2022 19:24   DG Ankle Complete Right  Result Date: 11/06/2022 CLINICAL DATA:  Rolled ankle EXAM: RIGHT ANKLE - COMPLETE 3+ VIEW COMPARISON:  None. FINDINGS: There is soft tissue swelling surrounding the ankle. There is no acute fracture or dislocation. Joint spaces are well maintained. IMPRESSION: Soft tissue swelling without acute fracture or dislocation. Electronically Signed   By: Darliss Cheney M.D.   On: 11/06/2022 19:24    Procedures Procedures (including critical care time)  Medications Ordered in UC Medications - No data to display  Initial Impression / Assessment and Plan / UC Course  I have reviewed the triage vital signs and the nursing notes.  Pertinent labs & imaging results that were available during my care of the patient were reviewed by me and considered in my medical decision making (see chart for details).     X-rays were negative for any acute bony abnormality.  Suspect ankle sprain. ACE wrap  applied by clinical staff.  Advised patient to not to sleep in this.  Advised RICE.  Advised following up with orthopedist if symptoms persist or worsen.  Patient verbalized understanding and was agreeable with plan. Final Clinical Impressions(s) / UC Diagnoses   Final diagnoses:  Acute right ankle pain  Right foot pain  Discharge Instructions       X-ray was normal.  Suspect ankle sprain.  Ace wrap has been applied.  Do not sleep in this.  Elevate extremity and apply ice.  Follow-up with orthopedist if symptoms persist or worsen.    ED Prescriptions   None    PDMP not reviewed this encounter.   Gustavus Bryant, [place] 11/06/22 2006

## 2022-11-06 NOTE — ED Triage Notes (Signed)
Pt c/o right ankle injury occurred ~ 3 weeks ago has pain and edema.

## 2022-11-06 NOTE — Discharge Instructions (Signed)
X-ray was normal.  Suspect ankle sprain.  Ace wrap has been applied.  Do not sleep in this.  Elevate extremity and apply ice.  Follow-up with orthopedist if symptoms persist or worsen.

## 2023-12-05 ENCOUNTER — Other Ambulatory Visit: Payer: Self-pay | Admitting: Orthopaedic Surgery

## 2023-12-05 DIAGNOSIS — M25571 Pain in right ankle and joints of right foot: Secondary | ICD-10-CM

## 2023-12-05 DIAGNOSIS — M79671 Pain in right foot: Secondary | ICD-10-CM

## 2023-12-28 ENCOUNTER — Other Ambulatory Visit

## 2023-12-28 ENCOUNTER — Other Ambulatory Visit: Payer: Self-pay

## 2023-12-30 ENCOUNTER — Ambulatory Visit
Admission: RE | Admit: 2023-12-30 | Discharge: 2023-12-30 | Discharge status: Home / Self Care | Source: Ambulatory Visit | Attending: Orthopaedic Surgery

## 2023-12-30 ENCOUNTER — Ambulatory Visit
Admission: RE | Admit: 2023-12-30 | Discharge: 2023-12-30 | Disposition: A | Discharge status: Home / Self Care | Payer: Self-pay | Source: Ambulatory Visit | Attending: Orthopaedic Surgery | Admitting: Orthopaedic Surgery

## 2023-12-30 DIAGNOSIS — M25571 Pain in right ankle and joints of right foot: Secondary | ICD-10-CM

## 2023-12-30 DIAGNOSIS — M79671 Pain in right foot: Secondary | ICD-10-CM
